# Patient Record
Sex: Female | Born: 1980 | Race: White | Hispanic: Yes | Marital: Single | Smoking: Former smoker
Health system: Southern US, Community
[De-identification: ages and names within clinical notes are randomized; demographics above are authoritative.]

## PROBLEM LIST (undated history)

## (undated) DIAGNOSIS — I10 Essential (primary) hypertension: Secondary | ICD-10-CM

## (undated) DIAGNOSIS — R7303 Prediabetes: Secondary | ICD-10-CM

## (undated) HISTORY — DX: Prediabetes: R73.03

## (undated) HISTORY — DX: Essential (primary) hypertension: I10

---

## 2016-06-24 ENCOUNTER — Encounter (INDEPENDENT_AMBULATORY_CARE_PROVIDER_SITE_OTHER): Payer: Self-pay | Admitting: Physician Assistant

## 2016-06-24 ENCOUNTER — Ambulatory Visit (INDEPENDENT_AMBULATORY_CARE_PROVIDER_SITE_OTHER): Payer: Self-pay | Admitting: Physician Assistant

## 2016-06-24 VITALS — BP 134/84 | HR 73 | Temp 97.9°F | Ht 65.5 in | Wt 270.4 lb

## 2016-06-24 DIAGNOSIS — I10 Essential (primary) hypertension: Secondary | ICD-10-CM

## 2016-06-24 DIAGNOSIS — G5601 Carpal tunnel syndrome, right upper limb: Secondary | ICD-10-CM

## 2016-06-24 DIAGNOSIS — R946 Abnormal results of thyroid function studies: Secondary | ICD-10-CM

## 2016-06-24 DIAGNOSIS — R7989 Other specified abnormal findings of blood chemistry: Secondary | ICD-10-CM

## 2016-06-24 DIAGNOSIS — Z79899 Other long term (current) drug therapy: Secondary | ICD-10-CM

## 2016-06-24 HISTORY — DX: Essential (primary) hypertension: I10

## 2016-06-24 LAB — POCT URINE PREGNANCY: Preg Test, Ur: NEGATIVE

## 2016-06-24 MED ORDER — LOSARTAN POTASSIUM-HCTZ 100-12.5 MG PO TABS: 1.0000 | ORAL_TABLET | Freq: Every day | ORAL | 1 refills | Status: DC

## 2016-06-24 MED ORDER — PREDNISONE 20 MG PO TABS: 20.0000 mg | ORAL_TABLET | Freq: Every day | ORAL | 0 refills | Status: AC

## 2016-06-24 MED FILL — LOSARTAN-HCTZ 100-12.5 MG T: 100-12.5 | 30 days supply | Qty: 30 | Fill #0

## 2016-06-24 MED FILL — predniSONE 20 MG TABS: 20 | 7 days supply | Qty: 7 | Fill #0
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
## 2016-06-24 NOTE — Patient Instructions (Signed)
Plan de alimentacin DASH (DASH Eating Plan) DASH es la sigla en ingls de "Enfoques Alimentarios para Detener la Hipertensin". El plan de alimentacin DASH ha demostrado bajar la presin arterial elevada (hipertensin). Los beneficios adicionales para la salud pueden incluir la disminucin del riesgo de diabetes mellitus tipo2, enfermedades cardacas e ictus. Este plan tambin puede ayudar a adelgazar. QU DEBO SABER ACERCA DEL PLAN DE ALIMENTACIN DASH? Para el plan de alimentacin DASH, seguir las siguientes pautas generales:  Elija los alimentos que contienen menos de 150 miligramos de sodio por porcin (segn se indica en la etiqueta de los alimentos).  Use hierbas o aderezos sin sal, en lugar de sal de mesa o sal marina.  Consulte al mdico o farmacutico antes de usar sustitutos de la sal.  Consuma los productos con menor contenido de sodio. Estos productos suelen estar etiquetados como "bajo en sodio" o "sin agregado de sal".  Coma alimentos frescos. No consuma una gran cantidad de alimentos enlatados.  Coma ms verduras, frutas y productos lcteos con bajo contenido de grasas.  Elija los cereales integrales. Busque la palabra "integral" en el primer lugar de la lista de ingredientes.  Elija el pescado y el pollo o el pavo sin piel ms a menudo que las carnes rojas. Limite el consumo de pescado, carne de ave y carne a 6onzas (170g) por da.  Limite el consumo de dulces, postres, azcares y bebidas azucaradas.  Elija las grasas saludables para el corazn.  Consuma ms comida casera y menos de restaurante, de buf y comida rpida.  Limite el consumo de alimentos fritos.  No fra los alimentos. A la hora de cocinarlos, opte por hornearlos, hervirlos, grillarlos y asarlos a la parrilla.  Cuando coma en un restaurante, pida que preparen su comida con menos sal o, en lo posible, sin nada de sal. QU ALIMENTOS PUEDO COMER? Pida ayuda a un nutricionista para conocer las  necesidades calricas individuales. Cereales Pan de salvado o integral. Arroz integral. Pastas de salvado o integrales. Quinua, trigo burgol y cereales integrales. Cereales con bajo contenido de sodio. Tortillas de harina de maz o de salvado. Pan de maz integral. Galletas saladas integrales. Galletas con bajo contenido de sodio. Vegetales Verduras frescas o congeladas (crudas, al vapor, asadas o grilladas). Jugos de tomate y verduras con contenido bajo o reducido de sodio. Pasta y salsa de tomate con contenido bajo o reducido de sodio. Verduras enlatadas con bajo contenido de sodio o reducido de sodio. Frutas Frutas frescas, en conserva (en su jugo natural) o frutas congeladas. Carnes y otros productos con protenas Carne de res molida (al 85% o ms magra), carne de res de animales alimentados con pastos o carne de res sin la grasa. Pollo o pavo sin piel. Carne de pollo o de pavo molida. Cerdo sin la grasa. Todos los pescados y frutos de mar. Huevos. Porotos, guisantes o lentejas secos. Frutos secos y semillas sin sal. Frijoles enlatados sin sal. Lcteos Productos lcteos con bajo contenido de grasas, como leche descremada o al 1%, quesos reducidos en grasas o al 2%, ricota con bajo contenido de grasas o queso cottage, o yogur natural con bajo contenido de grasas. Quesos con contenido bajo o reducido de sodio. Grasas y aceites Margarinas en barra que no contengan grasas trans. [place]onesa y alios para ensaladas livianos o reducidos en grasas (reducidos en sodio). [place]. Aceites de crtamo, oliva o canola. Mantequilla natural de man o almendra. Otros Palomitas de maz y pretzels sin sal. Los artculos mencionados arriba pueden no   ser [employer] de las bebidas o los alimentos recomendados. Comunquese con el nutricionista para conocer ms opciones. QU ALIMENTOS NO SE RECOMIENDAN? Cereales Pan blanco. Pastas blancas. Arroz blanco. Pan de maz refinado. Bagels y croissants. Galletas  saladas que contengan grasas trans. Vegetales Vegetales con crema o fritos. Verduras en salsa de [place]. Verduras enlatadas comunes. Pasta y salsa de tomate en lata comunes. Jugos comunes de tomate y de verduras. Nils Pyle Fruta enlatada en almbar liviano o espeso. Jugo de frutas. Carnes y otros productos con protenas Cortes de carne con Holiday representative. Costillas, alas de pollo, tocineta, salchicha, mortadela, salame, chinchulines, tocino, perros calientes, salchichas alemanas y embutidos envasados. Frutos secos y semillas con sal. Frijoles con sal en lata. Lcteos Leche entera o al 2%, crema, mezcla de [place] y crema, y queso crema. Yogur entero o endulzado. Quesos o queso azul con alto contenido de Neurosurgeon. Cremas no lcteas y coberturas batidas. Quesos procesados, quesos para untar o cuajadas. Condimentos Sal de cebolla y ajo, sal condimentada, sal de mesa y sal marina. Salsas en lata y envasadas. Salsa [place]. Salsa trtara. Salsa barbacoa. Salsa teriyaki. Salsa de soja, incluso la que tiene contenido reducido de [place]. Salsa de carne. Salsa de pescado. Salsa de [place]. Salsa rosada. Rbano picante. Ketchup y mostaza. Saborizantes y tiernizantes para carne. Caldo en cubitos. Salsa picante. Salsa tabasco. Adobos. Aderezos para tacos. Salsas. Grasas y [address], [place] en barra, [place] de [place], [place], [place] clarificada y Steffanie Rainwater de tocino. Aceites de coco, de palmiste o de palma. Aderezos comunes para ensalada. Otros Pickles y [place]. Palomitas de maz y pretzels con sal. Los artculos mencionados arriba pueden no ser [employer] de las bebidas y los alimentos que se Theatre stage manager. Comunquese con el nutricionista para obtener ms informacin. DNDE Raelyn Mora MS INFORMACIN? Instituto Nacional del [place], del Pulmn y de Risk manager (National Heart, Lung, and Blood Institute): [email or web address] Esta informacin no tiene Microbiologist el consejo del mdico. Asegrese de hacerle al mdico cualquier pregunta que tenga. Document Released: 03/27/2011 Document Revised: 07/30/2015 Document Reviewed: 02/09/2013 Elsevier Interactive Patient Education  2017 Elsevier Inc. Sndrome del tnel carpiano (Carpal Tunnel Syndrome) El sndrome del tnel carpiano es una afeccin que causa dolor en la mano y en el brazo. El tnel carpiano es un espacio estrecho ubicado en el lado palmar de la Ramsay. Los movimientos de la [place] o ciertas enfermedades pueden causar hinchazn del tnel. Esta hinchazn comprime el nervio principal de la mueca (nervio mediano). CAUSAS Esta afeccin puede ser causada por lo siguiente:  Movimientos repetidos de [employer].  Lesiones en la [place].  Artritis.  Un quiste o un tumor en el tnel carpiano.  Acumulacin de lquido Academic librarian. A veces, se desconoce la causa de esta afeccin. FACTORES DE RIESGO Es ms probable que esta afeccin se manifieste en:  Las personas que tienen trabajos en los que deben Humana Inc mismos movimientos repetidos de las [place], como los carniceros y los cajeros.  Las mujeres.  Las personas que tienen determinadas enfermedades, por ejemplo:  Diabetes.  Obesidad.  Tiroides hipoactiva (hipotiroidismo).  Insuficiencia renal. SNTOMAS Los sntomas de esta afeccin incluyen lo siguiente:  Sensacin de hormigueo en los dedos de la mano, Dentist, el ndice y el dedo [place].  Hormigueo o adormecimiento en la mano.  Sensacin de Engineer, mining en todo el brazo, especialmente cuando la [place] y el codo estn flexionados durante [place].  Dolor en la mueca que sube por el brazo  hasta el hombro.  Dolor que baja por la mano o los dedos.  Sensacin de [place] Oildebilidad en las manos. Tal vez tenga dificultad para tomar y Personal assistantsostener objetos. Los sntomas pueden empeorar durante la noche. DIAGNSTICO Esta afeccin se diagnostica mediante la historia  clnica y un examen fsico. Tambin pueden hacerle exmenes, que incluyen los siguientes:  Electromiografa (EMG). Esta prueba mide las seales elctricas que los nervios les envan a los msculos.  Radiografas. TRATAMIENTO El tratamiento de esta afeccin incluye lo siguiente:  Cambios en el estilo de vida. Es importante dejar de Radio producerhacer o modificar la actividad que caus la afeccin.  Fisioterapia o terapia ocupacional.  Analgsicos y antiinflamatorios. Esto puede incluir medicamentos que se inyectan en la [place].  Una frula para la [place].  [place]uga. INSTRUCCIONES PARA EL CUIDADO EN EL HOGAR Si tiene una frula:  sela como se lo haya indicado el mdico. Qutesela solamente como se lo haya indicado el mdico.  Afloje la frula si los dedos se le entumecen, siente hormigueos o se le enfran y se tornan de Research officer, trade unioncolor azul.  Mantenga la frula limpia y seca. Instrucciones generales  Baxter Internationalome los medicamentos de venta libre y los recetados solamente como se lo haya indicado el mdico.  Haga reposar la Chemultmueca de toda actividad que le cause dolor. Si la afeccin tiene relacin con Kathie Dikeel trabajo, hable con su empleador Tribune Companysobre los cambios que pueden Welshhacerse, por Centerviewejemplo, usar una almohadilla para apoyar la mueca mientras tipea.  Si se lo indican, aplique hielo sobre la zona dolorida:  Ponga el hielo en una bolsa plstica.  Coloque una toalla entre la piel y la bolsa de hielo.  Coloque el hielo durante 20minutos, 2 a 3veces por Futures traderda.  Concurra a todas las visitas de control como se lo haya indicado el mdico. Esto es importante.  Haga los ejercicios como se lo hayan indicado el mdico, el fisioterapeuta o el terapeuta ocupacional. SOLICITE ATENCIN MDICA SI:  Aparecen nuevos sntomas.  El dolor no se alivia con los United Parcelmedicamentos.  Los sntomas empeoran. Esta informacin no tiene Theme park managercomo fin reemplazar el consejo del mdico. Asegrese de hacerle al mdico cualquier pregunta que  tenga. Document Released: 04/07/2005 Document Revised: 07/30/2015 Document Reviewed: 08/23/2014 Elsevier Interactive Patient Education  2017 ArvinMeritorElsevier Inc.

## 2016-06-25 LAB — CBC WITH DIFFERENTIAL/PLATELET
BASOS ABS: 0.1 10*3/uL (ref 0.0–0.2)
Basos: 1 %
EOS (ABSOLUTE): 0.2 10*3/uL (ref 0.0–0.4)
Eos: 2 %
Hematocrit: 41.8 % (ref 34.0–46.6)
Hemoglobin: 14 g/dL (ref 11.1–15.9)
IMMATURE GRANULOCYTES: 0 %
Immature Grans (Abs): 0 10*3/uL (ref 0.0–0.1)
Lymphocytes Absolute: 3.5 10*3/uL — ABNORMAL HIGH (ref 0.7–3.1)
Lymphs: 34 %
MCH: 30 pg (ref 26.6–33.0)
MCHC: 33.5 g/dL (ref 31.5–35.7)
MCV: 90 fL (ref 79–97)
MONOS ABS: 0.6 10*3/uL (ref 0.1–0.9)
Monocytes: 6 %
NEUTROS PCT: 57 %
Neutrophils Absolute: 5.8 10*3/uL (ref 1.4–7.0)
PLATELETS: 420 10*3/uL — AB (ref 150–379)
RBC: 4.67 x10E6/uL (ref 3.77–5.28)
RDW: 13.6 % (ref 12.3–15.4)
WBC: 10.1 10*3/uL (ref 3.4–10.8)

## 2016-06-25 LAB — COMPREHENSIVE METABOLIC PANEL
A/G RATIO: 1.5 (ref 1.2–2.2)
ALT: 42 IU/L — AB (ref 0–32)
AST: 39 IU/L (ref 0–40)
Albumin: 4.3 g/dL (ref 3.5–5.5)
Alkaline Phosphatase: 86 IU/L (ref 39–117)
BUN/Creatinine Ratio: 16 (ref 9–23)
BUN: 14 mg/dL (ref 6–20)
Bilirubin Total: 0.4 mg/dL (ref 0.0–1.2)
CALCIUM: 9.3 mg/dL (ref 8.7–10.2)
CO2: 26 mmol/L (ref 18–29)
CREATININE: 0.86 mg/dL (ref 0.57–1.00)
Chloride: 99 mmol/L (ref 96–106)
GFR calc Af Amer: 101 mL/min/{1.73_m2} (ref >59)
GFR, EST NON AFRICAN AMERICAN: 88 mL/min/{1.73_m2} (ref >59)
GLUCOSE: 95 mg/dL (ref 65–99)
Globulin, Total: 2.9 g/dL (ref 1.5–4.5)
POTASSIUM: 3.9 mmol/L (ref 3.5–5.2)
Sodium: 140 mmol/L (ref 134–144)
Total Protein: 7.2 g/dL (ref 6.0–8.5)

## 2016-06-25 LAB — LIPID PANEL
CHOL/HDL RATIO: 3.8 ratio (ref 0.0–4.4)
Cholesterol, Total: 148 mg/dL (ref 100–199)
HDL: 39 mg/dL — AB (ref >39)
LDL Calculated: 86 mg/dL (ref 0–99)
TRIGLYCERIDES: 114 mg/dL (ref 0–149)
VLDL Cholesterol Cal: 23 mg/dL (ref 5–40)

## 2016-06-25 LAB — THYROID PANEL WITH TSH
Free Thyroxine Index: 1.7 (ref 1.2–4.9)
T3 Uptake Ratio: 21 % — ABNORMAL LOW (ref 24–39)
T4, Total: 8.1 ug/dL (ref 4.5–12.0)
TSH: 3.92 u[IU]/mL (ref 0.450–4.500)

## 2016-07-04 ENCOUNTER — Ambulatory Visit (INDEPENDENT_AMBULATORY_CARE_PROVIDER_SITE_OTHER): Payer: Self-pay

## 2016-07-23 MED FILL — LOSARTAN-HCTZ 100-12.5 MG T: 100-12.5 | 30 days supply | Qty: 30 | Fill #1

## 2016-08-21 MED FILL — LOSARTAN-HCTZ 100-12.5 MG T: 100-12.5 | 30 days supply | Qty: 30 | Fill #2

## 2016-09-19 MED FILL — LOSARTAN-HCTZ 100-12.5 MG T: 100-12.5 | 30 days supply | Qty: 30 | Fill #3

## 2016-10-07 ENCOUNTER — Encounter: Payer: Self-pay | Admitting: Internal Medicine

## 2016-10-07 ENCOUNTER — Other Ambulatory Visit (HOSPITAL_COMMUNITY)
Admission: RE | Admit: 2016-10-07 | Discharge: 2016-10-07 | Disposition: A | Discharge status: Home / Self Care | Payer: BLUE CROSS/BLUE SHIELD | Source: Ambulatory Visit | Attending: Internal Medicine | Admitting: Internal Medicine

## 2016-10-07 ENCOUNTER — Ambulatory Visit: Payer: BLUE CROSS/BLUE SHIELD | Attending: Internal Medicine | Admitting: Internal Medicine

## 2016-10-07 VITALS — BP 124/86 | HR 83 | Temp 98.1°F | Resp 16 | Wt 273.0 lb

## 2016-10-07 DIAGNOSIS — Z23 Encounter for immunization: Secondary | ICD-10-CM | POA: Diagnosis not present

## 2016-10-07 DIAGNOSIS — Z808 Family history of malignant neoplasm of other organs or systems: Secondary | ICD-10-CM | POA: Insufficient documentation

## 2016-10-07 DIAGNOSIS — J301 Allergic rhinitis due to pollen: Secondary | ICD-10-CM | POA: Diagnosis not present

## 2016-10-07 DIAGNOSIS — Z87891 Personal history of nicotine dependence: Secondary | ICD-10-CM | POA: Insufficient documentation

## 2016-10-07 DIAGNOSIS — Z131 Encounter for screening for diabetes mellitus: Secondary | ICD-10-CM | POA: Diagnosis not present

## 2016-10-07 DIAGNOSIS — Z833 Family history of diabetes mellitus: Secondary | ICD-10-CM | POA: Insufficient documentation

## 2016-10-07 DIAGNOSIS — M7712 Lateral epicondylitis, left elbow: Secondary | ICD-10-CM | POA: Diagnosis not present

## 2016-10-07 DIAGNOSIS — I10 Essential (primary) hypertension: Secondary | ICD-10-CM | POA: Insufficient documentation

## 2016-10-07 DIAGNOSIS — R7303 Prediabetes: Secondary | ICD-10-CM | POA: Diagnosis not present

## 2016-10-07 DIAGNOSIS — J302 Other seasonal allergic rhinitis: Secondary | ICD-10-CM | POA: Insufficient documentation

## 2016-10-07 DIAGNOSIS — N911 Secondary amenorrhea: Secondary | ICD-10-CM | POA: Insufficient documentation

## 2016-10-07 DIAGNOSIS — N6320 Unspecified lump in the left breast, unspecified quadrant: Secondary | ICD-10-CM | POA: Diagnosis not present

## 2016-10-07 DIAGNOSIS — Z8249 Family history of ischemic heart disease and other diseases of the circulatory system: Secondary | ICD-10-CM | POA: Insufficient documentation

## 2016-10-07 DIAGNOSIS — Z124 Encounter for screening for malignant neoplasm of cervix: Secondary | ICD-10-CM | POA: Insufficient documentation

## 2016-10-07 DIAGNOSIS — G5603 Carpal tunnel syndrome, bilateral upper limbs: Secondary | ICD-10-CM | POA: Insufficient documentation

## 2016-10-07 DIAGNOSIS — M7711 Lateral epicondylitis, right elbow: Secondary | ICD-10-CM | POA: Insufficient documentation

## 2016-10-07 LAB — POCT GLYCOSYLATED HEMOGLOBIN (HGB A1C): Hemoglobin A1C: 6.1

## 2016-10-07 LAB — POCT URINE PREGNANCY: Preg Test, Ur: NEGATIVE

## 2016-10-07 MED ORDER — FLUTICASONE PROPIONATE 50 MCG/ACT NA SUSP: 2.0000 | Freq: Every day | NASAL | 6 refills | Status: DC

## 2016-10-07 MED ORDER — CETIRIZINE HCL 10 MG PO TABS: 10.0000 mg | ORAL_TABLET | Freq: Every day | ORAL | 6 refills | Status: AC

## 2016-10-07 MED ORDER — MEDROXYPROGESTERONE ACETATE 10 MG PO TABS: ORAL_TABLET | ORAL | 6 refills | Status: AC

## 2016-10-07 MED ORDER — DICLOFENAC SODIUM 1 % TD GEL: 2.0000 g | Freq: Four times a day (QID) | TRANSDERMAL | 2 refills | Status: DC

## 2016-10-07 MED ORDER — METFORMIN HCL 500 MG PO TABS: 500.0000 mg | ORAL_TABLET | Freq: Every day | ORAL | 3 refills | Status: DC

## 2016-10-07 MED ORDER — PSEUDOEPHEDRINE HCL 30 MG PO TABS: 30.0000 mg | ORAL_TABLET | Freq: Three times a day (TID) | ORAL | 0 refills | Status: DC | PRN

## 2016-10-07 MED FILL — VOLTAREN 1% GEL: 1 | 12 days supply | Qty: 100 | Fill #0

## 2016-10-07 MED FILL — ?CETIRIZINE HCL 10 MG TABLE: 10 | 30 days supply | Qty: 30 | Fill #0

## 2016-10-07 MED FILL — ?METFORMIN HCL 500MG TABLET: 500 | 30 days supply | Qty: 30 | Fill #0

## 2016-10-07 MED FILL — FLUTICASONE PROP 50 MCG SPR: 50 | 30 days supply | Qty: 16 | Fill #0

## 2016-10-07 MED FILL — MEDROXYPROGESTERONE 10 MG T: 10 | 7 days supply | Qty: 7 | Fill #0
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
## 2016-10-07 NOTE — Progress Notes (Signed)
Pt states [place] has not had a period in 4 months

## 2016-10-07 NOTE — Patient Instructions (Addendum)
Prediabetes (Prediabetes) QU ES LA PREDIABETES? La prediabetes es la enfermedad que presenta un nivel de azcar en la sangre (glucemia) ms alto de lo normal, pero no lo suficientemente alto como para que le diagnostiquen diabetes tipo2. El hecho de ser prediabtico lo pone en riesgo de desarrollar diabetes tipo2 (diabetes mellitus tipo2). La prediabetes tambin se puede llamar intolerancia a la glucosa o glucosa alterada en ayunas. Generalmente, la prediabetes no causa sntomas. El mdico puede diagnosticar esta enfermedad por los anlisis de sangre. Los anlisis para detectar la prediabetes se pueden realizar si usted tiene sobrepeso y si presenta al menos un factor de riesgo ms de prediabetes. Entre los factores de riesgo de prediabetes, se incluyen los siguientes:  Tener un familiar con diabetes tipo2.  Sobrepeso u obesidad.  Tener ms de 45 aos.  Ser descendiente de indgenas norteamericanos, afroamericanos, hispanos o latinos, o asiticos o isleos del Pacfico.  Tener un estilo de vida inactivo (sedentario).  Tener antecedentes de diabetes gestacional o sndrome de ovario poliqustico (SOP).  Tener niveles bajos del colesterol bueno (HDL-C) o niveles altos de grasas en la sangre (triglicridos).  Tener hipertensin arterial. QU ES LA GLUCEMIA Y CMO SE MIDE? La glucemia hace referencia a la cantidad de glucosa que tiene en el torrente sanguneo. La glucosa proviene de los alimentos que contienen azcar y almidn (carbohidratos) que el organismo descompone para formar glucosa. El nivel de glucemia se puede medir en mg/dl (miligramos por decilitro) o mmol/l (milimoles por litro).La glucemia puede controlarse con uno o ms de los siguientes anlisis de sangre:  Medicin de la glucemia en ayunas. No se le permitir comer (tendr que hacer ayuno) durante al menos 8horas antes de que se tome una muestra de sangre. ? Un rango normal de glucemia en ayunas es de 70 a 100mg/dl (de  3,9 a 5,6mmol/l).  Un anlisis de sangre de A1c (hemoglobina A1c). Este anlisis proporciona informacin sobre el control de la glucemia durante los ltimos 2 o 3meses.  Prueba de tolerancia a la glucosa oral (PTGO). Esta prueba mide la glucemia dos veces: ? Despus del ayuno. Este es el valor inicial. ? Dos horas despus de ingerir una bebida que contiene glucosa. Pueden diagnosticarle prediabetes en los siguientes casos:  Si la glucemia en ayunas es de 100 a 125mg/dl (de 5,6 a 6,9mmol/l).  Si el nivel de A1c es del 5,7% al 6,4%.  Si el resultado de la PTGO es de 140 a 199mg/dl (de 7,8 a 11mmol/l). Estos anlisis de sangre se pueden repetir para confirmar el diagnstico. QU SUCEDE SI LA GLUCEMIA ES DEMASIADO ALTA? El pncreas produce una hormona (insulina) que ayuda a mover la glucosa desde el torrente sanguneo hacia las clulas. Cuando las clulas no responden de forma adecuada a la insulina que el organismo produce (resistencia a la insulina), el exceso de glucosa se acumula en la sangre en vez de dirigirse hacia las clulas. Como consecuencia, se puede desarrollar glucemia alta (hiperglucemia), que puede causar muchas complicaciones. Este es uno de los sntomas de la prediabetes. QU PUEDE SUCEDER SI LA GLUCEMIA PERMANECE MS ALTA DE LO NORMAL DURANTE MUCHO TIEMPO? Es peligroso tener la glucemia alta durante mucho tiempo. Demasiada glucosa en la sangre puede daar los nervios y los vasos sanguneos. El dao a largo plazo puede provocar complicaciones de la diabetes, por ejemplo:  Cardiopata.  Ictus.  Ceguera.  Enfermedad renal.  Depresin.  Mala circulacin en los pies y en las piernas, que podra llevar a la extraccin quirrgica (amputacin) en   casos graves. CMO SE PUEDE EVITAR QUE LA PREDIABETES SE CONVIERTA EN DIABETES TIPO2? Para prevenir la diabetes tipo2, tome las siguientes medidas:  Haga actividad fsica. ? Haga actividad fsica de intensidad moderada  durante al menos 30minutos como mnimo 5das por Wells Fargosemana, o tanto como le haya indicado el mdico. Podra hacer caminatas dinmicas, ciclismo o Moroccogimnasia acutica. ? Pregntele al mdico qu actividades son seguras para usted. Una combinacin de actividades puede ser la mejor opcin, por ejemplo, caminar, practicar natacin, andar en bicicleta y hacer entrenamiento de fuerza.  Baje de General Electricpeso como se lo haya indicado el mdico. ? Bajar entre el 5% y el 7% del peso corporal puede revertir la resistencia a la insulina. ? El mdico puede determinar cuntos kilos tiene que bajar y Adamsayudarlo a que adelgace de Wellsite geologistmanera segura.  Siga un plan de alimentacin saludable. Este incluye consumir protenas magras, hidratos de carbono complejos, frutas y verduras frescas, productos lcteos con bajo contenido de grasa y grasas saludables. ? Siga las indicaciones del mdico respecto de las restricciones para las comidas o las bebidas. ? Programe una cita con un especialista en alimentacin y nutricin (nutricionista certificado) para que lo ayude a Quarry managerarmar un plan de alimentacin saludable adecuado para usted.  No fume ni consuma ningn producto que contenga tabaco, lo que incluye cigarrillos, tabaco de Theatre managermascar y Administrator, Civil Servicecigarrillos electrnicos. Si necesita ayuda para dejar de fumar, consulte al American Expressmdico.  Baxter Internationalome los medicamentos de venta libre y los recetados como se lo haya indicado el mdico. Es posible que le receten medicamentos que ayuden a disminuir el riesgo de tener diabetes tipo2. Esta informacin no tiene Theme park managercomo fin reemplazar el consejo del mdico. Asegrese de hacerle al mdico cualquier pregunta que tenga. Document Released: 05/29/2015 Document Revised: 05/29/2015 Document Reviewed: 05/29/2015 Elsevier Interactive Patient Education  2018 [employer] provider. Make sure you discuss any questions you have with your health care provider. Document Released: 02/02/2006 Document Revised: 12/27/2015 Document Reviewed:  12/27/2015 Elsevier Interactive Patient Education  2017 Elsevier Inc.  Madilyn FiremanVacuna Td (contra la difteria y el ttanos): Lo que debe saber (Td Vaccine [place][Tetanus and Diphtheria]: What You Need to Know) 1. Por qu vacunarse? El ttanos y la difteria son enfermedades muy graves. Son Academic librarianpoco frecuentes en los [place] actualmente, pero las personas que se infectan suelen tener complicaciones graves. La vacuna Td se [place]a para proteger a los adolescentes y a los adultos de ambas enfermedades. Tanto el ttanos como la difteria son infecciones causadas por bacterias. La difteria se transmite de persona a persona a travs de la tos o el estornudo. La bacteria que causa el ttanos entra al cuerpo a travs de cortes, raspones o heridas. El TTANOS (trismo) provoca entumecimiento y Engineer, materialscontraccin dolorosa de los msculos, por lo general, en todo el cuerpo.  Puede causar el endurecimiento de los msculos de la cabeza y el cuello, de modo que impide abrir la boca, tragar y en algunos casos, Industrial/product designerrespirar. El ttanos es causa de muerte en aproximadamente 1de cada 10personas que contraen la infeccin, incluso despus de que reciben la mejor atencin mdica. La DIFTERIA puede hacer que se forme una membrana gruesa en la parte posterior de la garganta.  Puede causar problemas respiratorios, parlisis, insuficiencia cardaca e incluso la muerte. Antes de las vacunas, en los [place] se informaban 200000 casos de difteria y cientos de casos de ttanos cada ao. Desde que comenz la vacunacin, los informes de casos de ambas enfermedades se han reducido en un 99%. 2. Vacuna Td La  vacuna Td protege a adolescentes y adultos contra el ttanos y la difteria. La vacuna Td habitualmente se aplica como dosis de refuerzo cada 10aos, pero tambin puede administrarse antes si la persona sufre una [place] o herida sucia y grave. A veces, en lugar de la vacuna Td, se recomienda una vacuna llamada Tdap, que protege contra la  tosferina, adems de proteger contra el ttanos y la difteria. El mdico o la persona que le aplique la vacuna puede darle ms informacin al [employer]. La Td puede administrarse de manera segura simultneamente con otras vacunas. 3. Algunas personas no deben recibir la vacuna  Una persona que alguna vez ha tenido una reaccin alrgica potencialmente mortal a una dosis anterior de cualquier vacuna contra el ttanos o la difteria, O que tenga una alergia grave a cualquier parte de esta vacuna, no debe recibir la vacuna Td. Informe a la persona que le aplica la vacuna si usted tiene cualquier alergia grave.  Consulte con su mdico si: ? tuvo hinchazn o dolor intenso despus de recibir cualquier vacuna contra la difteria o el ttanos, ? alguna vez ha sufrido el sndrome de Pension scheme manager, ? no se siente Research scientist (life sciences) en que se ha programado la vacuna.  4. Riesgos de [place] reaccin a la vacuna Con cualquier medicamento, incluyendo las vacunas, existe la posibilidad de que aparezcan efectos secundarios. Suelen ser leves y desaparecen por s solos. Tambin son posibles las reacciones graves, pero en raras ocasiones. La [employer] de las personas a las que se les aplica la vacuna Td no tienen ningn problema. Problemas leves despus de la vacuna Td: (No interfirieron en otras actividades)  Dolor en el lugar donde se aplic la vacuna (alrededor de 8de cada 10personas)  Enrojecimiento o hinchazn en el lugar donde se aplic la vacuna (alrededor de 1de cada 4personas)  Fiebre leve (poco frecuente)  Dolor de Training and development officer (alrededor de 1de cada 4personas)  Cansancio (alrededor de 1de cada 4personas) Problemas moderados despus de la vacuna Td: (Interfirieron en otras actividades, pero no requirieron atencin mdica)  Fiebre superior a 102F (38,8C) (poco frecuente) Problemas graves despus de la vacuna Td: (Impidieron Education officer, environmental las actividades habituales; requirieron atencin mdica)  Buyer, retail,  dolor intenso, sangrado o enrojecimiento en el brazo en que se aplic la vacuna (poco frecuente). Problemas que podran ocurrir despus de cualquier vacuna:  Las personas a veces se desmayan despus de un procedimiento mdico, incluida la vacunacin. Si permanece sentado o recostado durante 15 minutos puede ayudar a [employer] y las lesiones causadas por las cadas. Informe al mdico si se siente mareado, tiene cambios en la visin o zumbidos en los odos.  Algunas personas sienten un dolor intenso en el hombro y tienen dificultad para mover el brazo donde se coloc la vacuna. Esto sucede con muy poca frecuencia.  Cualquier medicamento puede causar una reaccin alrgica grave. Dichas reacciones son Lynnae Sandhoff poco frecuentes con una vacuna (se calcula que menos de 1en un milln de dosis) y se producen de unos minutos a unas horas despus de Arts development officer. Al igual que con cualquier [employer] Data, existe una probabilidad muy remota de que una vacuna cause una lesin grave o la [place]. Se controla permanentemente la seguridad de las vacunas. Para obtener ms informacin, visite: http://floyd.org/. 5. Qu pasa si hay una reaccin grave? A qu signos debo estar atento?  Observe todo lo que le preocupe, como signos de una reaccin alrgica grave, fiebre muy alta o comportamiento fuera de lo normal. Los signos de [place] reaccin  alrgica grave pueden incluir ronchas, hinchazn de la cara y la garganta, dificultad para respirar, latidos cardacos acelerados, mareos y debilidad. Generalmente, estos comenzaran entre unos pocos minutos y algunas horas despus de la vacunacin. Qu debo hacer?  Si usted piensa que se trata de una reaccin alrgica grave o de otra emergencia que no puede esperar, llame al 911 o dirjase al hospital ms cercano. Sino, llame a su mdico.  Despus, la reaccin debe informarse al [id number] de Informacin sobre Efectos Adversos de las [place] (Vaccine Adverse Event  Reporting System, VAERS). Su mdico puede presentar este informe, o puede hacerlo usted mismo a travs del sitio web de VAERS, en www.vaers.LAgents.no, o llamando al [phone]. VAERS no brinda recomendaciones mdicas. 6. [employer] de Compensacin de Daos por [employer] Power [place] de Compensacin de Daos por Administrator, arts (National Vaccine Injury Compensation Program, VICP) es un programa federal que fue creado para Patent examiner a las personas que puedan haber sufrido daos al recibir ciertas vacunas. Aquellas personas que consideren que han sufrido un dao como consecuencia de una vacuna y [place] saber ms acerca del programa y de cmo presentar Roslynn Amble, pueden llamar al [phone] o visitar su sitio web en [email or web address]. Hay un lmite de tiempo para presentar un reclamo de compensacin. 7. Cmo puedo obtener ms informacin?  Consulte a su mdico. Este puede darle el prospecto de la vacuna o recomendarle otras fuentes de informacin.  Comunquese con el servicio de salud de su localidad o [address].  Comunquese con los Centros para Air traffic controller y la Prevencin de Child psychotherapist for Disease Control and Prevention , CDC). ? Llame al 7650906677 (1-800-CDC-INFO). ? Visite el sitio Environmental manager en [email or web address]. Declaracin de informacin sobre la vacuna contra la difteria y el ttanos (Td) de los CDC (07/31/15) Esta informacin no tiene Theme park manager el consejo del mdico. Asegrese de hacerle al mdico cualquier pregunta que tenga. Document Released: 07/24/2008 Document Revised: 04/28/2014 Document Reviewed: 07/31/2015 Elsevier Interactive Patient Education  2017 [employer].

## 2016-10-08 ENCOUNTER — Telehealth: Payer: Self-pay

## 2016-10-08 LAB — CERVICOVAGINAL ANCILLARY ONLY
Bacterial vaginitis: POSITIVE — AB
CHLAMYDIA, DNA PROBE: NEGATIVE
Candida vaginitis: NEGATIVE
NEISSERIA GONORRHEA: NEGATIVE
TRICH (WINDOWPATH): NEGATIVE

## 2016-10-08 LAB — HIV ANTIBODY (ROUTINE TESTING W REFLEX): HIV SCREEN 4TH GENERATION: NONREACTIVE

## 2016-10-08 NOTE — Telephone Encounter (Signed)
[employer] Francesco RunnerSander Luz Id# [id number] contacted pt to go over lab results pt didn't answer lvm asking pt to give me a call at her earliest convenience   If pt calls back please give results: HIV test negative. Await PAP results.

## 2016-10-09 ENCOUNTER — Other Ambulatory Visit: Payer: Self-pay | Admitting: Internal Medicine

## 2016-10-09 DIAGNOSIS — N760 Acute vaginitis: Secondary | ICD-10-CM

## 2016-10-09 DIAGNOSIS — B9689 Other specified bacterial agents as the cause of diseases classified elsewhere: Secondary | ICD-10-CM

## 2016-10-09 DIAGNOSIS — R87610 Atypical squamous cells of undetermined significance on cytologic smear of cervix (ASC-US): Secondary | ICD-10-CM

## 2016-10-09 LAB — CYTOLOGY - PAP
DIAGNOSIS: UNDETERMINED — AB
HPV (WINDOPATH): DETECTED — AB

## 2016-10-09 MED ORDER — METRONIDAZOLE 500 MG PO TABS: 500.0000 mg | ORAL_TABLET | Freq: Three times a day (TID) | ORAL | 0 refills | Status: DC

## 2016-10-10 MED FILL — ?METRONIDAZOLE 500 MG TABLE: 500 | 7 days supply | Qty: 21 | Fill #0

## 2016-10-13 ENCOUNTER — Other Ambulatory Visit: Payer: Self-pay | Admitting: Internal Medicine

## 2016-10-13 DIAGNOSIS — N6320 Unspecified lump in the left breast, unspecified quadrant: Secondary | ICD-10-CM

## 2016-10-14 ENCOUNTER — Ambulatory Visit (INDEPENDENT_AMBULATORY_CARE_PROVIDER_SITE_OTHER): Payer: BLUE CROSS/BLUE SHIELD | Admitting: Orthopaedic Surgery

## 2016-10-14 DIAGNOSIS — G5601 Carpal tunnel syndrome, right upper limb: Secondary | ICD-10-CM | POA: Diagnosis not present

## 2016-10-14 DIAGNOSIS — M7712 Lateral epicondylitis, left elbow: Secondary | ICD-10-CM

## 2016-10-14 DIAGNOSIS — M7711 Lateral epicondylitis, right elbow: Secondary | ICD-10-CM | POA: Diagnosis not present

## 2016-10-14 DIAGNOSIS — G5602 Carpal tunnel syndrome, left upper limb: Secondary | ICD-10-CM

## 2016-10-14 MED ORDER — LIDOCAINE HCL 1 % IJ SOLN
1.0000 mL | INTRAMUSCULAR | Status: AC | PRN
  Administered 2016-10-14: 1 mL

## 2016-10-14 MED ORDER — BUPIVACAINE HCL 0.5 % IJ SOLN
1.0000 mL | INTRAMUSCULAR | Status: AC | PRN
  Administered 2016-10-14: 1 mL

## 2016-10-14 MED ORDER — METHYLPREDNISOLONE ACETATE 40 MG/ML IJ SUSP
40.0000 mg | INTRAMUSCULAR | Status: AC | PRN
  Administered 2016-10-14: 40 mg

## 2016-10-14 NOTE — Progress Notes (Signed)
Office Visit Note   Patient: Cheryl Manning           Date of Birth: November 03, 1980           MRN: [id number] Visit Date: 10/14/2016              Requested by: Marcine Matar, MD [address] [place], [place] [address] PCP: Marcine Matar, MD   Assessment & Plan: Visit Diagnoses:  1. Right carpal tunnel syndrome   2. Left carpal tunnel syndrome   3. Lateral epicondylitis of both elbows     Plan: Overall impression is bilateral tennis elbow and bilateral carpal tunnel syndrome. Elbows were injected today under sterile conditions. Nerve conduction studies ordered. Follow-up after the nerve conduction studies. Physical therapy was ordered for her tennis elbow. Today's encounter was performed through an interpreter which did increase the complexity of the visit.  Follow-Up Instructions: Return if symptoms worsen or fail to improve.   Orders:  No orders of the defined types were placed in this encounter.  No orders of the defined types were placed in this encounter.     Procedures: Hand/UE Inj Date/Time: 10/14/2016 11:27 AM Performed by: Tarry Kos Authorized by: Tarry Kos   Consent Given by:  Patient Timeout: prior to procedure the correct patient, procedure, and site was verified   Indications:  Pain Condition: lateral epicondylitis   Site:  R elbow Prep: patient was prepped and draped in usual sterile fashion   Needle Size:  25 G Medications:  1 mL bupivacaine 0.5 %; 1 mL lidocaine 1 %; 40 mg methylPREDNISolone acetate 40 MG/ML Hand/UE Inj Date/Time: 10/14/2016 11:27 AM Performed by: Tarry Kos Authorized by: Tarry Kos   Consent Given by:  Patient Timeout: prior to procedure the correct patient, procedure, and site was verified   Indications:  Pain Condition: lateral epicondylitis   Site:  L elbow Prep: patient was prepped and draped in usual sterile fashion   Needle Size:  25 G Medications:  1 mL bupivacaine 0.5 %; 1 mL lidocaine 1 %;  40 mg methylPREDNISolone acetate 40 MG/ML     Clinical Data: No additional findings.   Subjective: Chief Complaint  Patient presents with  . Right Wrist - Pain  . Left Wrist - Pain    Patient is a 36 year old female comes in with her lower elbow and bilateral hand pain. She was told that she had carpal tunnel syndrome but has not had nerve conduction studies. She has trouble sleeping. She is worn nighttime braces with no relief. She complains of constant pain and burning in both of her hands. She doesn't really have bilateral elbow pain is worse with activity. Pain does not radiate. Denies any numbness or tingling.    Review of Systems  Constitutional: Negative.   HENT: Negative.   Eyes: Negative.   Respiratory: Negative.   Cardiovascular: Negative.   Endocrine: Negative.   Musculoskeletal: Negative.   Neurological: Negative.   Hematological: Negative.   Psychiatric/Behavioral: Negative.   All other systems reviewed and are negative.    Objective: Vital Signs: There were no vitals taken for this visit.  Physical Exam  Constitutional: She is oriented to person, place, and time. She appears well-developed and well-nourished.  HENT:  Head: Normocephalic and atraumatic.  Eyes: EOM are normal.  Neck: Neck supple.  Pulmonary/Chest: Effort normal.  Abdominal: Soft.  Neurological: She is alert and oriented to person, place, and time.  Skin: Skin is warm. Capillary  refill takes less than 2 seconds.  Psychiatric: She has a normal mood and affect. Her behavior is normal. Judgment and thought content normal.  Nursing note and vitals reviewed.   Ortho Exam Bilateral elbow exam shows tenderness to palpation over the lateral epicondyle. Radial tunnel is nontender. Negative Tinel at radial tunnel. She has good range of motion of the elbow.  Bilateral hand exam shows negative carpal tunnel compression signs. No muscular atrophy. Specialty Comments:  No specialty comments  available.  Imaging: No results found.   PMFS History: Patient Active Problem List   Diagnosis Date Noted  . Diabetes mellitus screening 10/07/2016  . Seasonal allergic rhinitis due to pollen 10/07/2016  . Lateral epicondylitis of both elbows 10/07/2016  . Bilateral carpal tunnel syndrome 10/07/2016  . Left breast lump 10/07/2016  . Secondary amenorrhea 10/07/2016  . HTN (hypertension) 06/24/2016   No past medical history on file.  Family History  Problem Relation Age of Onset  . Diabetes Father   . Hypertension Father   . Uterine cancer Sister   . Hypertension Brother   . Drug abuse Paternal Uncle   . Uterine cancer Maternal Grandmother   . Uterine cancer Paternal Grandmother     No past surgical history on file. Social History   Occupational History  . Not on file.   Social History Main Topics  . Smoking status: Former Games developermoker  . Smokeless tobacco: Never Used  . Alcohol use Not on file  . Drug use: Unknown  . Sexual activity: Not on file

## 2016-10-17 MED FILL — MEDROXYPROGESTERONE 10 MG T: 10 | 30 days supply | Qty: 7 | Fill #1

## 2016-10-17 MED FILL — LOSARTAN-HCTZ 100-12.5 MG T: 100-12.5 | 30 days supply | Qty: 30 | Fill #4

## 2016-10-21 ENCOUNTER — Encounter: Payer: Self-pay | Admitting: Physical Therapy

## 2016-10-21 ENCOUNTER — Ambulatory Visit: Payer: BLUE CROSS/BLUE SHIELD | Attending: Orthopaedic Surgery | Admitting: Physical Therapy

## 2016-10-21 DIAGNOSIS — M25631 Stiffness of right wrist, not elsewhere classified: Secondary | ICD-10-CM | POA: Diagnosis present

## 2016-10-21 DIAGNOSIS — M79601 Pain in right arm: Secondary | ICD-10-CM | POA: Diagnosis present

## 2016-10-21 DIAGNOSIS — R209 Unspecified disturbances of skin sensation: Secondary | ICD-10-CM

## 2016-10-21 DIAGNOSIS — M25632 Stiffness of left wrist, not elsewhere classified: Secondary | ICD-10-CM | POA: Insufficient documentation

## 2016-10-21 DIAGNOSIS — M79602 Pain in left arm: Secondary | ICD-10-CM | POA: Diagnosis not present

## 2016-10-21 NOTE — Therapy (Signed)
[facility] [address] [place], [place], [address] Phone: [phone]   Fax:  [phone]  Physical Therapy Evaluation  Patient Details  Name: Cheryl Manning MRN: [id number] Date of Birth: [birth year] Referring Provider: Dr. Roda Shutters   Encounter Date: 10/21/2016      PT End of Session - 10/21/16 0857    Visit Number 1   Number of Visits 12   Date for PT Re-Evaluation 12/02/16   PT Start Time 0755   PT Stop Time 0842   PT Time Calculation (min) 47 min   Activity Tolerance Patient tolerated treatment well   Behavior During Therapy [facility] for tasks assessed/performed      History reviewed. No pertinent past medical history.  History reviewed. No pertinent surgical history.  There were no vitals filed for this visit.       Subjective Assessment - 10/21/16 0802    Subjective Pt reports onset of pain about 4 mos ago.  The pain began gradually, is intermittent during the day, she feels most of her pain at night.  She has numbness in her digits 1-3 and get warm and purple only while she sleeps.  She reports swelling in elbows.  Denies neck pain.    Pertinent History CTS, diabetes and HTN   Limitations Lifting;Writing;House hold activities;Other (comment)  sleep    Diagnostic tests NCV test to be scheduled   Patient Stated Goals Pt would like the pain to go away.    Currently in Pain? Yes   Pain Score 2   none at rest, 2/10 after exam    Pain Location Elbow   Pain Orientation Right;Left   Pain Descriptors / Indicators Discomfort   Pain Type Chronic pain   Pain Radiating Towards thumb is numb    Pain Onset More than a month ago   Pain Frequency Intermittent   Aggravating Factors  PM , gripping and using her hands, arms    Pain Relieving Factors medicine, recently had injection, ice, wears night splints to help and has an elbow sleeve for work    Effect of Pain on Daily Activities mostly sleep             [facility] PT  Assessment - 10/21/16 0001      Assessment   Medical Diagnosis bilateral tennis elbow    Referring Provider Dr. Roda Shutters    Onset Date/Surgical Date --  4 mos    Hand Dominance Right   Next MD Visit 3 weeks    Prior Therapy not for this issue      Precautions   Precautions None     Restrictions   Weight Bearing Restrictions No     Balance Screen   Has the patient fallen in the past 6 months No     Home Environment   Living Environment Private residence     Prior Function   Level of Independence Independent     Cognition   Overall Cognitive Status Within Functional Limits for tasks assessed     Observation/Other Assessments   Focus on Therapeutic Outcomes (FOTO)  44%     Sensation   Light Touch Appears Intact   Additional Comments reports continuous numbness in thumb      Posture/Postural Control   Posture/Postural Control Postural limitations   Postural Limitations Rounded Shoulders;Forward head     AROM   Right/Left Elbow --  tight elbow extension end range    Right Wrist Extension 42 Degrees   Right Wrist Flexion  50 Degrees   Left Wrist Extension 60 Degrees   Left Wrist Flexion 59 Degrees     Strength   Overall Strength Comments Grip Rt.  38, 39, 40 kg Lt 30, 36, 25 kg    Right/Left Shoulder --  WFL flex and abd    Right Elbow Flexion 5/5   Right Elbow Extension 5/5   Left Elbow Flexion 5/5   Left Elbow Extension 5/5   Right Wrist Flexion 4+/5   Right Wrist Extension 5/5   Left Wrist Flexion 4+/5   Left Wrist Extension 4+/5     Palpation   Palpation comment TTP lateral epicondyle and prox 1/3 forearm bilateral         Objective measurements completed on examination: See above findings.         [facility] Adult PT Treatment/Exercise - 10/21/16 0001      Self-Care   Self-Care Posture;Other Self-Care Comments   Posture upper body, nerve compression   Other Self-Care Comments  HEP, anatomy     Shoulder Exercises: Stretch   Corner Stretch 3 reps;30  seconds     Wrist Exercises   Wrist Flexion Self ROM;Both;5 reps   Wrist Extension Self ROM;Both;5 reps     Iontophoresis   Type of Iontophoresis Dexamethasone   Location bilateral lateral epicondyle    Dose 1 cc    Time 6 hour patch            PT Education - 10/21/16 0856    Education provided Yes   Education Details PT/POC, HEP, posture and stretching, anatomy and differential diagnosis    Person(s) Educated Patient   Methods Explanation;Handout   Comprehension Verbalized understanding;Returned demonstration          PT Short Term Goals - 10/21/16 0903      PT SHORT TERM GOAL #1   Title Pt will be I with stretches/HEP to reduce pain.    Time 3   Period Weeks   Status New     PT SHORT TERM GOAL #2   Title Pt will be able to report up to 15-25% less pain/symptoms in arms, hands to improve comfort at night/sleep hygiene.    Time 3   Period Weeks   Status New           PT Long Term Goals - 10/21/16 0906      PT LONG TERM GOAL #1   Title Pt will be able to score less than 30% impaired on FOTO to reflect functional improvement.    Time 6   Period Weeks   Status New     PT LONG TERM GOAL #2   Title Pt will be I with more advanced HEP for posture and stretching.    Time 6   Period Weeks   Status New     PT LONG TERM GOAL #3   Title Pt will able to sleep with min disturbance only, most nights of the week (75% improved)   Time 6   Period Weeks   Status New     PT LONG TERM GOAL #4   Title Pt will perform grip strength testing with 8 kg improvement in dominant hand.    Time 6   Period Weeks   Status New                Plan - 10/21/16 6962    Clinical Impression Statement Pt presents for low complexity eval of bilateral lateral epicondylitis which began along with wrist/hand pain .  She was diagnosed by primary MD with CTS.  Signs and symptoms are consistent with nerve entrapment and tendonitis.  She has weakened grip, decreasaed muscle  endurance and poor posture.  Occupation contributes to her pain.  She should do well with stretching and modalities to reduce tension in forearm, wrist and in pectorals as well.     Clinical Presentation Stable   Clinical Decision Making Low   Rehab Potential Excellent   PT Frequency 2x / week   PT Duration 6 weeks   PT Treatment/Interventions ADLs/Self Care Home Management;Cryotherapy;Electrical Stimulation;Iontophoresis 4mg /ml Dexamethasone;Moist Heat;Ultrasound;Passive range of motion;Therapeutic exercise;Therapeutic activities;Manual techniques;Patient/family education;Dry needling;Taping   PT Next Visit Plan check HEP, consider TPDN to forearm, wrist ext.  manual repeat ionto? how was it? include time to check CTS special tests    PT Home Exercise Plan wrist flex/ext ROM and corner stretching 3 ways    Consulted and Agree with Plan of Care Patient      Patient will benefit from skilled therapeutic intervention in order to improve the following deficits and impairments:  Increased fascial restricitons, Impaired UE functional use, Pain, Obesity, Impaired sensation, Postural dysfunction, Impaired flexibility, Decreased range of motion  Visit Diagnosis: Pain in left arm  Pain in right arm  Stiffness of left wrist, not elsewhere classified  Stiffness of right wrist, not elsewhere classified  Unspecified disturbances of skin sensation     Problem List Patient Active Problem List   Diagnosis Date Noted  . Diabetes mellitus screening 10/07/2016  . Seasonal allergic rhinitis due to pollen 10/07/2016  . Lateral epicondylitis of both elbows 10/07/2016  . Bilateral carpal tunnel syndrome 10/07/2016  . Left breast lump 10/07/2016  . Secondary amenorrhea 10/07/2016  . HTN (hypertension) 06/24/2016    PAA,JENNIFER 10/21/2016, 9:16 AM  [facility] [address] [place], [place], [address] Phone: [phone]   Fax:   [phone]  Name: Cheryl LollCinthia Manning MRN: [id number] Date of Birth: 08-Nov-1980   Karie MainlandJennifer Paa, PT 10/21/16 9:16 AM Phone: [phone] Fax: [phone]

## 2016-10-23 ENCOUNTER — Telehealth: Payer: Self-pay

## 2016-10-23 NOTE — Telephone Encounter (Signed)
CMA call regarding lab results   Patient did not answer but left a VM stating the reason of the call & to call back  

## 2016-10-23 NOTE — Telephone Encounter (Signed)
-----   Message from Marcine Matareborah B Johnson, MD sent at 10/09/2016 10:33 PM EDT ----- Let pt know that her pap test is abnormal.  I will need to refer her to GYN for further eval and management.  Referral submitted. She also tested positive for bacterial vaginosis.  Not an STD.  Usually due to over growth of normal bacteria in the vagina.  I will send a rxn for antibiotics to [facility] for pick up. Thanks.

## 2016-10-24 NOTE — Telephone Encounter (Signed)
CMA call regarding results  Patient verify DOB  Patient was aware and understood  

## 2016-10-24 NOTE — Telephone Encounter (Signed)
Patient is returning call to nurse.

## 2016-10-29 ENCOUNTER — Ambulatory Visit: Payer: BLUE CROSS/BLUE SHIELD | Admitting: Physical Therapy

## 2016-10-29 DIAGNOSIS — R209 Unspecified disturbances of skin sensation: Secondary | ICD-10-CM

## 2016-10-29 DIAGNOSIS — M79602 Pain in left arm: Secondary | ICD-10-CM | POA: Diagnosis not present

## 2016-10-29 DIAGNOSIS — M25631 Stiffness of right wrist, not elsewhere classified: Secondary | ICD-10-CM

## 2016-10-29 DIAGNOSIS — M25632 Stiffness of left wrist, not elsewhere classified: Secondary | ICD-10-CM

## 2016-10-29 DIAGNOSIS — M79601 Pain in right arm: Secondary | ICD-10-CM

## 2016-10-29 NOTE — Therapy (Signed)
[facility] [address] [place], [place], [address] Phone: [phone]   Fax:  [phone]  Physical Therapy Treatment  Patient Details  Name: Cheryl Manning MRN: [id number] Date of Birth: [birth year] Referring Provider: Dr. Roda Shutters   Encounter Date: 10/29/2016      PT End of Session - 10/29/16 0814    Visit Number 2   Number of Visits 12   Date for PT Re-Evaluation 12/02/16   PT Start Time 0809  pt late    PT Stop Time 0847   PT Time Calculation (min) 38 min   Activity Tolerance Patient tolerated treatment well   Behavior During Therapy [facility] for tasks assessed/performed      No past medical history on file.  No past surgical history on file.  There were no vitals filed for this visit.      Subjective Assessment - 10/29/16 0811    Subjective No new complaints .  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  Pain mostly at night.    Currently in Pain? Yes   Pain Score 2            [facility] Adult PT Treatment/Exercise - 10/29/16 0001      Shoulder Exercises: Stretch   Corner Stretch 3 reps;30 seconds     Wrist Exercises   Forearm Supination Strengthening;Both;20 reps   Bar Weights/Barbell (Forearm Supination) 1 lb   Forearm Pronation Strengthening;Both;20 reps   Bar Weights/Barbell (Forearm Pronation) 1 lb   Wrist Flexion Strengthening;Both;20 reps   Bar Weights/Barbell (Wrist Flexion) 1 lb   Wrist Extension Strengthening;Both;20 reps   Bar Weights/Barbell (Wrist Extension) 1 lb   Wrist Radial Deviation Strengthening;Both;20 reps   Bar Weights/Barbell (Radial Deviation) 1 lb   Wrist Ulnar Deviation Strengthening;Both;20 reps   Bar Weights/Barbell (Ulnar Deviation) 1 lb   Other wrist exercises wrist ROM flex and extension self stretching      Iontophoresis   Type of Iontophoresis Dexamethasone   Location bilateral lateral epicondyle    Dose 1 cc    Time 6  hour patch      Manual Therapy   Manual Therapy Soft tissue mobilization;Myofascial release;Passive ROM   Soft tissue mobilization wrist extensors bilat   Myofascial Release forearms bilat, deep tissue work, deep pressure well tolerated   Passive ROM wrist flexion, ext with elbow ext                   PT Short Term Goals - 10/29/16 1045      PT SHORT TERM GOAL #1   Title Pt will be I with stretches/HEP to reduce pain.    Status On-going     PT SHORT TERM GOAL #2   Title Pt will be able to report up to 15-25% less pain/symptoms in arms, hands to improve comfort at night/sleep hygiene.    Status On-going           PT Long Term Goals - 10/29/16 1046      PT LONG TERM GOAL #1   Title Pt will be able to score less than 30% impaired on FOTO to reflect functional improvement.    Status On-going     PT LONG TERM GOAL #2   Title Pt will be I with more advanced HEP for posture and stretching.    Status On-going     PT LONG TERM GOAL #3   Title Pt will able to sleep with min  disturbance only, most nights of the week (75% improved)   Status On-going     PT LONG TERM GOAL #4   Title Pt will perform grip strength testing with 8 kg improvement in dominant hand.    Status On-going               Plan - 10/29/16 1044    Clinical Impression Statement Pt continues ot have mod to mild pain in L >R elbow.  She has significant tightness in forearm muscles, painful when palpated and with deep tissue work.  She is I with initial HEP.  Open to scheduling for dry needling.    PT Next Visit Plan check HEP, consider TPDN to forearm, wrist ext.  manual repeat ionto? how was it? include time to check CTS special tests    PT Home Exercise Plan wrist flex/ext ROM and corner stretching 3 ways    Consulted and Agree with Plan of Care Patient      Patient will benefit from skilled therapeutic intervention in order to improve the following deficits and impairments:  Increased fascial  restricitons, Impaired UE functional use, Pain, Obesity, Impaired sensation, Postural dysfunction, Impaired flexibility, Decreased range of motion  Visit Diagnosis: Pain in left arm  Pain in right arm  Stiffness of left wrist, not elsewhere classified  Stiffness of right wrist, not elsewhere classified  Unspecified disturbances of skin sensation     Problem List Patient Active Problem List   Diagnosis Date Noted  . Diabetes mellitus screening 10/07/2016  . Seasonal allergic rhinitis due to pollen 10/07/2016  . Lateral epicondylitis of both elbows 10/07/2016  . Bilateral carpal tunnel syndrome 10/07/2016  . Left breast lump 10/07/2016  . Secondary amenorrhea 10/07/2016  . HTN (hypertension) 06/24/2016    Bran Aldridge 10/29/2016, 10:53 AM  [facility] [address] [place], [place], [address] Phone: [phone]   Fax:  [phone]  Name: Cheryl LollCinthia Manning MRN: [id number] Date of Birth: 1981-01-04  Karie MainlandJennifer Lenni Reckner, PT 10/29/16 10:53 AM Phone: [phone] Fax: [phone]

## 2016-11-03 ENCOUNTER — Ambulatory Visit: Payer: BLUE CROSS/BLUE SHIELD | Admitting: Physical Therapy

## 2016-11-03 DIAGNOSIS — R209 Unspecified disturbances of skin sensation: Secondary | ICD-10-CM

## 2016-11-03 DIAGNOSIS — M79602 Pain in left arm: Secondary | ICD-10-CM | POA: Diagnosis not present

## 2016-11-03 DIAGNOSIS — M25631 Stiffness of right wrist, not elsewhere classified: Secondary | ICD-10-CM

## 2016-11-03 DIAGNOSIS — M79601 Pain in right arm: Secondary | ICD-10-CM

## 2016-11-03 DIAGNOSIS — M25632 Stiffness of left wrist, not elsewhere classified: Secondary | ICD-10-CM

## 2016-11-03 NOTE — Therapy (Signed)
[place], [place], [address] Phone: [phone]   Fax:  [phone]  Physical Therapy Treatment  Patient Details  Name: Rucha Wissinger MRN: [id number] Date of Birth: 20-Nov-1980 Referring Provider: Dr. Erlinda Hong   Encounter Date: 11/03/2016      PT End of Session - 11/03/16 0818    Visit Number 3   Number of Visits 12   Date for PT Re-Evaluation 12/02/16   PT Start Time 0804   PT Stop Time 0845   PT Time Calculation (min) 41 min   Activity Tolerance Patient tolerated treatment well   Behavior During Therapy [facility] for tasks assessed/performed      No past medical history on file.  No past surgical history on file.  There were no vitals filed for this visit.      Subjective Assessment - 11/03/16 0808    Subjective Has deep pain today, each elbow.  Last visit helped but pain returned.  Today pain is unusually high, unable to say what may have caused this.    Currently in Pain? Yes   Pain Score 7    Pain Location Elbow   Pain Orientation Right;Left   Pain Type Chronic pain   Pain Onset More than a month ago   Pain Frequency Intermittent   Aggravating Factors  night time, gripping    Pain Relieving Factors meds, ice , elbow sleeve             [facility] Adult PT Treatment/Exercise - 11/03/16 0001      Self-Care   Other Self-Care Comments  printed out a picture of elbow strap for counterforce, also advised agaist Biofreexe AND ice to avoid chemical burn      Elbow Exercises   Wrist Flexion Strengthening;Both;20 reps   Bar Weights/Barbell (Wrist Flexion) 1 lb   Wrist Extension Strengthening;Both;20 reps   Bar Weights/Barbell (Wrist Extension) 1 lb     Shoulder Exercises: ROM/Strengthening   Other ROM/Strengthening Exercises supine chest press 2 lbs x 20 wrist in neutral      Shoulder Exercises: Stretch   Other Shoulder Stretches supine pec stretch added wrist flex/ext      Wrist Exercises    Other wrist exercises circles x 20 each UE and each direction    Other wrist exercises wrist ROM flex and extension self stretching   x 10 flex, ext AROM      Cryotherapy   Number Minutes Cryotherapy 8 Minutes   Cryotherapy Location Other (comment)  elbows   Type of Cryotherapy Ice pack     Iontophoresis   Type of Iontophoresis Dexamethasone   Location bilateral lateral epicondyle    Dose 1 cc    Time 6 hour patch      Manual Therapy   Manual Therapy Soft tissue mobilization;Myofascial release;Passive ROM   Soft tissue mobilization wrist extensors bilat   Myofascial Release forearms bilat, deep tissue work, deep pressure well tolerated   Passive ROM wrist flexion, ext with elbow ext                   PT Short Term Goals - 10/29/16 1045      PT SHORT TERM GOAL #1   Title Pt will be I with stretches/HEP to reduce pain.    Status On-going     PT SHORT TERM GOAL #2   Title Pt will be able to report up to 15-25% less pain/symptoms in arms, hands to improve comfort at night/sleep hygiene.  Status On-going           PT Long Term Goals - 10/29/16 1046      PT LONG TERM GOAL #1   Title Pt will be able to score less than 30% impaired on FOTO to reflect functional improvement.    Status On-going     PT LONG TERM GOAL #2   Title Pt will be I with more advanced HEP for posture and stretching.    Status On-going     PT LONG TERM GOAL #3   Title Pt will able to sleep with min disturbance only, most nights of the week (75% improved)   Status On-going     PT LONG TERM GOAL #4   Title Pt will perform grip strength testing with 8 kg improvement in dominant hand.    Status On-going               Plan - 11/03/16 0847    Clinical Impression Statement Pt with increased pain from working all weekend. Pain easy to localize at wrist extensor origin.  No further goals met.    PT Next Visit Plan check HEP, consider TPDN to forearm, wrist ext.  manual repeat  ionto   PT Home Exercise Plan wrist flex/ext ROM and corner stretching 3 ways    Consulted and Agree with Plan of Care Patient      Patient will benefit from skilled therapeutic intervention in order to improve the following deficits and impairments:  Increased fascial restricitons, Impaired UE functional use, Pain, Obesity, Impaired sensation, Postural dysfunction, Impaired flexibility, Decreased range of motion  Visit Diagnosis: Pain in left arm  Pain in right arm  Stiffness of left wrist, not elsewhere classified  Stiffness of right wrist, not elsewhere classified  Unspecified disturbances of skin sensation     Problem List Patient Active Problem List   Diagnosis Date Noted  . Diabetes mellitus screening 10/07/2016  . Seasonal allergic rhinitis due to pollen 10/07/2016  . Lateral epicondylitis of both elbows 10/07/2016  . Bilateral carpal tunnel syndrome 10/07/2016  . Left breast lump 10/07/2016  . Secondary amenorrhea 10/07/2016  . HTN (hypertension) 06/24/2016    Rowan Blaker 11/03/2016, 8:48 AM  [facility] [address] [place], [place], [address] Phone: [phone]   Fax:  [phone]  Name: Alera Quevedo MRN: [id number] Date of Birth: 1980/06/13  Raeford Razor, PT 11/03/16 8:48 AM Phone: [phone] Fax: [phone]

## 2016-11-05 ENCOUNTER — Ambulatory Visit: Payer: BLUE CROSS/BLUE SHIELD | Admitting: Physical Therapy

## 2016-11-05 ENCOUNTER — Encounter: Payer: Self-pay | Admitting: Physical Therapy

## 2016-11-05 DIAGNOSIS — M79602 Pain in left arm: Secondary | ICD-10-CM | POA: Diagnosis not present

## 2016-11-05 DIAGNOSIS — M79601 Pain in right arm: Secondary | ICD-10-CM

## 2016-11-05 DIAGNOSIS — M25632 Stiffness of left wrist, not elsewhere classified: Secondary | ICD-10-CM

## 2016-11-05 DIAGNOSIS — R209 Unspecified disturbances of skin sensation: Secondary | ICD-10-CM

## 2016-11-05 DIAGNOSIS — M25631 Stiffness of right wrist, not elsewhere classified: Secondary | ICD-10-CM

## 2016-11-05 NOTE — Therapy (Signed)
[facility] [address] [place], [place], [address] Phone: [phone]   Fax:  [phone]  Physical Therapy Treatment  Patient Details  Name: Cheryl Manning MRN: [id number] Date of Birth: 1981/04/13 Referring Provider: Dr. Roda ShuttersXu   Encounter Date: 11/05/2016      PT End of Session - 11/05/16 0937    Visit Number 4   Number of Visits 12   Date for PT Re-Evaluation 12/02/16   PT Start Time 0933   PT Stop Time 1018   PT Time Calculation (min) 45 min   Activity Tolerance Patient tolerated treatment well   Behavior During Therapy [facility] for tasks assessed/performed      History reviewed. No pertinent past medical history.  No past surgical history on file.  There were no vitals filed for this visit.      Subjective Assessment - 11/05/16 0931    Subjective "I Dont have pain, it just bothers me some in the joint with moving my hands to 4-5/10"    Currently in Pain? Yes   Pain Score 5    Pain Location Elbow   Pain Orientation Right;Left   Pain Onset More than a month ago   Pain Frequency Constant   Aggravating Factors  gripping, holding, lifting items                         [facility] Adult PT Treatment/Exercise - 11/05/16 0001      Wrist Exercises   Other wrist exercises wrist flexor stretching PNF 2 x 30 sec bil  contract/ relax with 10 sec contraction.      Iontophoresis   Type of Iontophoresis Dexamethasone   Location bilateral medial epicondyle    Dose 1 cc    Time 6 hour patch      Manual Therapy   Soft tissue mobilization Iastm over wrist flexors/ pronators   Myofascial Release forearms bilat, deep tissue work, deep pressure well tolerated   Passive ROM wrist flexion, ext with elbow ext           Trigger Point Dry Needling - 11/05/16 0935    Consent Given? Yes   Education Handout Provided Yes   Muscles Treated Upper Body --  pronator teres, and common flexor bil              PT Education - 11/05/16 0936    Education provided Yes   Education Details muscle anatomy and referral patterns. what TPDN is, benefits/ what to expect and after care.  extra time needed for interpretation   Person(s) Educated Patient   Methods Explanation;Verbal cues;Handout   Comprehension Verbalized understanding;Verbal cues required          PT Short Term Goals - 10/29/16 1045      PT SHORT TERM GOAL #1   Title Pt will be I with stretches/HEP to reduce pain.    Status On-going     PT SHORT TERM GOAL #2   Title Pt will be able to report up to 15-25% less pain/symptoms in arms, hands to improve comfort at night/sleep hygiene.    Status On-going           PT Long Term Goals - 10/29/16 1046      PT LONG TERM GOAL #1   Title Pt will be able to score less than 30% impaired on FOTO to reflect functional improvement.    Status On-going     PT LONG TERM GOAL #2  Title Pt will be I with more advanced HEP for posture and stretching.    Status On-going     PT LONG TERM GOAL #3   Title Pt will able to sleep with min disturbance only, most nights of the week (75% improved)   Status On-going     PT LONG TERM GOAL #4   Title Pt will perform grip strength testing with 8 kg improvement in dominant hand.    Status On-going               Plan - 11/05/16 1058    Clinical Impression Statement pt reported improvment of pain since last session but reported pain inthe medial epicondyle bil. TPDN was explained and performed on bil pronators and common wrist flexors. IASTM techniiques and stretch were utilized following TPDN which she reported relief of pain and tightness. conitnued ionto on bil elbows.    PT Next Visit Plan assess response to TPDN. check HEP, consider TPDN to forearm, wrist ext.  manual repeat ionto   PT Home Exercise Plan wrist flex/ext ROM and corner stretching 3 ways    Consulted and Agree with Plan of Care Patient      Patient will benefit from skilled  therapeutic intervention in order to improve the following deficits and impairments:     Visit Diagnosis: Pain in left arm  Pain in right arm  Stiffness of right wrist, not elsewhere classified  Stiffness of left wrist, not elsewhere classified  Unspecified disturbances of skin sensation     Problem List Patient Active Problem List   Diagnosis Date Noted  . Diabetes mellitus screening 10/07/2016  . Seasonal allergic rhinitis due to pollen 10/07/2016  . Lateral epicondylitis of both elbows 10/07/2016  . Bilateral carpal tunnel syndrome 10/07/2016  . Left breast lump 10/07/2016  . Secondary amenorrhea 10/07/2016  . HTN (hypertension) 06/24/2016   Lulu Riding PT, DPT, LAT, ATC  11/05/16  11:02 AM      [facility] [address] [place], [place], [address] Phone: [phone]   Fax:  [phone]  Name: Cheryl Manning MRN: [id number] Date of Birth: [birth year]

## 2016-11-10 ENCOUNTER — Encounter: Payer: Self-pay | Admitting: Obstetrics & Gynecology

## 2016-11-10 DIAGNOSIS — R87610 Atypical squamous cells of undetermined significance on cytologic smear of cervix (ASC-US): Secondary | ICD-10-CM | POA: Insufficient documentation

## 2016-11-10 DIAGNOSIS — R87810 Cervical high risk human papillomavirus (HPV) DNA test positive: Secondary | ICD-10-CM

## 2016-11-10 MED FILL — ?CETIRIZINE HCL 10 MG TABLE: 10 | 30 days supply | Qty: 30 | Fill #1

## 2016-11-11 ENCOUNTER — Ambulatory Visit: Payer: BLUE CROSS/BLUE SHIELD | Admitting: Physical Therapy

## 2016-11-11 ENCOUNTER — Encounter: Payer: Self-pay | Admitting: Physical Therapy

## 2016-11-11 DIAGNOSIS — M25631 Stiffness of right wrist, not elsewhere classified: Secondary | ICD-10-CM

## 2016-11-11 DIAGNOSIS — M79602 Pain in left arm: Secondary | ICD-10-CM | POA: Diagnosis not present

## 2016-11-11 DIAGNOSIS — R209 Unspecified disturbances of skin sensation: Secondary | ICD-10-CM

## 2016-11-11 DIAGNOSIS — M25632 Stiffness of left wrist, not elsewhere classified: Secondary | ICD-10-CM

## 2016-11-11 DIAGNOSIS — M79601 Pain in right arm: Secondary | ICD-10-CM

## 2016-11-11 NOTE — Therapy (Signed)
[place], [place], [address] Phone: [phone]   Fax:  [phone]  Physical Therapy Treatment  Patient Details  Name: Cheryl Manning MRN: [id number] Date of Birth: Apr 29, 1980 Referring Provider: Dr. Erlinda Hong   Encounter Date: 11/11/2016      PT End of Session - 11/11/16 0929    Visit Number 5   Number of Visits 12   Date for PT Re-Evaluation 12/02/16   PT Start Time 0803   PT Stop Time 0848   PT Time Calculation (min) 45 min   Activity Tolerance Patient tolerated treatment well   Behavior During Therapy [facility] for tasks assessed/performed      History reviewed. No pertinent past medical history.  History reviewed. No pertinent surgical history.  There were no vitals filed for this visit.      Subjective Assessment - 11/11/16 0808    Subjective No pain, only numbness in the last 3 fingers of Rt. hand.  None in L hand.     Currently in Pain? No/denies             [facility] Adult PT Treatment/Exercise - 11/11/16 0001      Elbow Exercises   Wrist Flexion Strengthening;Both;20 reps   Bar Weights/Barbell (Wrist Flexion) 2 lbs   Wrist Extension Strengthening;Both;20 reps   Bar Weights/Barbell (Wrist Extension) 2 lbs     Shoulder Exercises: ROM/Strengthening   UBE (Upper Arm Bike) level 1 UBE 2 min forward, 3 min in reverse      Shoulder Exercises: Stretch   Corner Stretch 3 reps;30 seconds   Other Shoulder Stretches wall stretches for bilateral      Wrist Exercises   Other wrist exercises wrist flexor and extensor stretching PNF 2 x 30 sec bil  contract/ relax with 10 sec contraction.      Iontophoresis   Type of Iontophoresis Dexamethasone   Location bilateral medial epicondyle    Dose 1 cc    Time 6 hour patch      Manual Therapy   Soft tissue mobilization Iastm over wrist flexors/ pronators   Myofascial Release forearms bilat, deep tissue work, deep pressure well tolerated    Passive ROM wrist flexion, ext with elbow ext                   PT Short Term Goals - 11/11/16 2035      PT SHORT TERM GOAL #1   Title Pt will be I with stretches/HEP to reduce pain.    Status Achieved     PT SHORT TERM GOAL #2   Title Pt will be able to report up to 15-25% less pain/symptoms in arms, hands to improve comfort at night/sleep hygiene.    Baseline can be uncomfortable at times, it hurts when I move a certain way    Status Achieved           PT Long Term Goals - 11/11/16 5974      PT LONG TERM GOAL #1   Title Pt will be able to score less than 30% impaired on FOTO to reflect functional improvement.    Status Unable to assess     PT LONG TERM GOAL #2   Title Pt will be I with more advanced HEP for posture and stretching.    Status On-going     PT LONG TERM GOAL #3   Title Pt will able to sleep with min disturbance only, most nights of the week (75% improved)  Baseline 50-75%    Status Partially Met     PT LONG TERM GOAL #4   Title Pt will perform grip strength testing with 8 kg improvement in dominant hand.    Status Unable to assess               Plan - 11/11/16 0930    Clinical Impression Statement Pt wiht mostly just numbness in lateral 3 digits of Rt. UE.  She had an increase in pain after dry needling but understands it was beneficial.  Goals in progress, of note, sleep and comfort at night 50-75% improved.    PT Next Visit Plan TPDN, cont manual and stretching, ionto    PT Home Exercise Plan wrist flex/ext ROM and corner stretching 3 ways    Consulted and Agree with Plan of Care Patient      Patient will benefit from skilled therapeutic intervention in order to improve the following deficits and impairments:     Visit Diagnosis: Pain in left arm  Pain in right arm  Stiffness of right wrist, not elsewhere classified  Unspecified disturbances of skin sensation  Stiffness of left wrist, not elsewhere  classified     Problem List Patient Active Problem List   Diagnosis Date Noted  . ASCUS with positive high risk HPV cervical 11/10/2016  . Diabetes mellitus screening 10/07/2016  . Seasonal allergic rhinitis due to pollen 10/07/2016  . Lateral epicondylitis of both elbows 10/07/2016  . Bilateral carpal tunnel syndrome 10/07/2016  . Left breast lump 10/07/2016  . Secondary amenorrhea 10/07/2016  . HTN (hypertension) 06/24/2016    PAA,JENNIFER 11/11/2016, 9:31 AM  [facility] [address] [place], [place], [address] Phone: [phone]   Fax:  [phone]  Name: Frankie Zito MRN: [id number] Date of Birth: [birth year]   Raeford Razor, PT 11/11/16 9:32 AM Phone: [phone] Fax: [phone]

## 2016-11-13 ENCOUNTER — Ambulatory Visit: Payer: BLUE CROSS/BLUE SHIELD | Admitting: Physical Therapy

## 2016-11-13 ENCOUNTER — Encounter: Payer: Self-pay | Admitting: Physical Therapy

## 2016-11-13 DIAGNOSIS — M25632 Stiffness of left wrist, not elsewhere classified: Secondary | ICD-10-CM

## 2016-11-13 DIAGNOSIS — R209 Unspecified disturbances of skin sensation: Secondary | ICD-10-CM

## 2016-11-13 DIAGNOSIS — M79602 Pain in left arm: Secondary | ICD-10-CM

## 2016-11-13 DIAGNOSIS — M25631 Stiffness of right wrist, not elsewhere classified: Secondary | ICD-10-CM

## 2016-11-13 DIAGNOSIS — M79601 Pain in right arm: Secondary | ICD-10-CM

## 2016-11-13 NOTE — Therapy (Addendum)
[place], [place], [address] Phone: [phone]   Fax:  [phone]  Physical Therapy Treatment  Patient Details  Name: Chelisa Hennen MRN: [id number] Date of Birth: [birth year] Referring Provider: Dr. Erlinda Hong   Encounter Date: 11/13/2016      PT End of Session - 11/13/16 0806    Visit Number 6   Number of Visits 12   Date for PT Re-Evaluation 12/02/16   PT Start Time 0805   PT Stop Time 0848   PT Time Calculation (min) 43 min   Activity Tolerance Patient tolerated treatment well   Behavior During Therapy [facility] for tasks assessed/performed      History reviewed. No pertinent past medical history.  History reviewed. No pertinent surgical history.  There were no vitals filed for this visit.      Subjective Assessment - 11/13/16 0809    Subjective " only have 2/10 pain in bil forearms"   Currently in Pain? Yes   Pain Score 2    Pain Location Elbow   Aggravating Factors  gripping, holding, lifting items   Pain Relieving Factors meds, ice, elbow                         [facility] Adult PT Treatment/Exercise - 11/13/16 0829      Elbow Exercises   Wrist Flexion Strengthening;Both;20 reps   Bar Weights/Barbell (Wrist Flexion) 2 lbs   Wrist Extension Strengthening;Both;20 reps   Bar Weights/Barbell (Wrist Extension) 2 lbs     Shoulder Exercises: ROM/Strengthening   UBE (Upper Arm Bike) level 1 UBE 2 min forward, 2 min in reverse      Wrist Exercises   Other wrist exercises tendon glides for the wrist/ hand 2 x 10 with 5 sec hold     Iontophoresis   Type of Iontophoresis Dexamethasone   Location bilateral medial epicondyle    Dose 1 cc    Time 6 hour patch      Manual Therapy   Manual therapy comments active release techniques over the bil wrist flexors.   Soft tissue mobilization Iastm over wrist flexors/ pronators   Passive ROM wrist flexion, ext with elbow ext            Trigger Point Dry Needling - 11/13/16 0817    Consent Given? Yes   Education Handout Provided No  given previously   Muscles Treated Upper Body --  bil pronator teres, common wrist flexors                PT Short Term Goals - 11/11/16 0819      PT SHORT TERM GOAL #1   Title Pt will be I with stretches/HEP to reduce pain.    Status Achieved     PT SHORT TERM GOAL #2   Title Pt will be able to report up to 15-25% less pain/symptoms in arms, hands to improve comfort at night/sleep hygiene.    Baseline can be uncomfortable at times, it hurts when I move a certain way    Status Achieved           PT Long Term Goals - 11/11/16 5809      PT LONG TERM GOAL #1   Title Pt will be able to score less than 30% impaired on FOTO to reflect functional improvement.    Status Unable to assess     PT LONG TERM GOAL #2   Title Pt will be  I with more advanced HEP for posture and stretching.    Status On-going     PT LONG TERM GOAL #3   Title Pt will able to sleep with min disturbance only, most nights of the week (75% improved)   Baseline 50-75%    Status Partially Met     PT LONG TERM GOAL #4   Title Pt will perform grip strength testing with 8 kg improvement in dominant hand.    Status Unable to assess               Plan - 11/13/16 1025    Clinical Impression Statement pt reported decrease pain and tighness in the wrist/ elbow. continued TPDN over bil wrist flexors. soft tissue and stretching techniques performed follwing DN which she reported significant relief of pain and tightness. she performed exercises given with no report of pain, updated HEP for tendon glides. continued Ionto to continue pain relief.    PT Next Visit Plan TPDN, cont manual and stretching, ionto    Consulted and Agree with Plan of Care Patient      Patient will benefit from skilled therapeutic intervention in order to improve the following deficits and impairments:  Increased fascial  restricitons, Impaired UE functional use, Pain, Obesity, Impaired sensation, Postural dysfunction, Impaired flexibility, Decreased range of motion  Visit Diagnosis: Pain in left arm  Pain in right arm  Stiffness of right wrist, not elsewhere classified  Unspecified disturbances of skin sensation  Stiffness of left wrist, not elsewhere classified     Problem List Patient Active Problem List   Diagnosis Date Noted  . ASCUS with positive high risk HPV cervical 11/10/2016  . Diabetes mellitus screening 10/07/2016  . Seasonal allergic rhinitis due to pollen 10/07/2016  . Lateral epicondylitis of both elbows 10/07/2016  . Bilateral carpal tunnel syndrome 10/07/2016  . Left breast lump 10/07/2016  . Secondary amenorrhea 10/07/2016  . HTN (hypertension) 06/24/2016   Starr Lake PT, DPT, LAT, ATC  11/13/16  12:57 PM      [facility] [address] [place], [place], [address] Phone: [phone]   Fax:  [phone]  Name: Roselie Cirigliano MRN: [id number] Date of Birth: 1981/02/22

## 2016-11-14 MED FILL — LOSARTAN-HCTZ 100-12.5 MG T: 100-12.5 | 30 days supply | Qty: 30 | Fill #5

## 2016-11-14 MED FILL — ?METFORMIN HCL 500MG TABLET: 500 | 30 days supply | Qty: 30 | Fill #1

## 2016-11-17 ENCOUNTER — Ambulatory Visit: Payer: BLUE CROSS/BLUE SHIELD | Admitting: Physical Therapy

## 2016-11-17 DIAGNOSIS — M79602 Pain in left arm: Secondary | ICD-10-CM

## 2016-11-17 DIAGNOSIS — M25632 Stiffness of left wrist, not elsewhere classified: Secondary | ICD-10-CM

## 2016-11-17 DIAGNOSIS — M25631 Stiffness of right wrist, not elsewhere classified: Secondary | ICD-10-CM

## 2016-11-17 DIAGNOSIS — R209 Unspecified disturbances of skin sensation: Secondary | ICD-10-CM

## 2016-11-17 DIAGNOSIS — M79601 Pain in right arm: Secondary | ICD-10-CM

## 2016-11-17 NOTE — Therapy (Signed)
[facility] [address] [place], [place], [address] Phone: [phone]   Fax:  [phone]  Physical Therapy Treatment  Patient Details  Name: Cheryl Manning MRN: [id number] Date of Birth: [birth year] Referring Provider: Dr. Roda ShuttersXu   Encounter Date: 11/17/2016      PT End of Session - 11/17/16 0805    Visit Number 7   Number of Visits 12   Date for PT Re-Evaluation 12/02/16   PT Start Time 0800   PT Stop Time 0847   PT Time Calculation (min) 47 min   Activity Tolerance Patient tolerated treatment well   Behavior [facility] for tasks assessed/performed      No past medical history on file.  No past surgical history on file.  There were no vitals filed for this visit.      Subjective Assessment - 11/17/16 0808    Subjective No pain, just my fingers are numb in Rt. hand. Pt has one more appt after today, she thinks she will be OK for DC next visit.    Currently in Pain? No/denies            [facility] - 11/17/16 0001      Strength   Overall Strength Comments Rt. 40, 48, 44 and Lt 33, 30, 35            [facility] Adult PT Treatment/Exercise - 11/17/16 0001      Shoulder Exercises: ROM/Strengthening   UBE (Upper Arm Bike) level 1 UBE 5 min in reverse    Other ROM/Strengthening Exercises standing postural strength with green band and ball: retraction, horiz abd and ER/IR unattached   Other ROM/Strengthening Exercises also overhead reach, cues to avoid scap protraction     Shoulder Exercises: Stretch   Corner Stretch 3 reps;30 seconds   Other Shoulder Stretches wall stretches for bilateral anterior shoulder (abd/ER)      Wrist Exercises   Other wrist exercises tendon glides for the wrist/ hand 2 x 10 with 5 sec hold   Other wrist exercises wrist flexor and extensior stretching for 30 sec x 3 each direction      Iontophoresis   Type of Iontophoresis Dexamethasone   Location bilateral  medial epicondyle    Dose 1 cc    Time 6 hour patch      Manual Therapy   Myofascial Release forearms bilat, deep tissue work, deep pressure well tolerated   Passive ROM wrist flexion, ext with elbow ext                   PT Short Term Goals - 11/11/16 [id number]      PT SHORT TERM GOAL #1   Title Pt will be I with stretches/HEP to reduce pain.    Status Achieved     PT SHORT TERM GOAL #2   Title Pt will be able to report up to 15-25% less pain/symptoms in arms, hands to improve comfort at night/sleep hygiene.    Baseline can be uncomfortable at times, it hurts when I move a certain way    Status Achieved           PT Long Term Goals - 11/17/16 0827      PT LONG TERM GOAL #1   Title Pt will be able to score less than 30% impaired on FOTO to reflect functional improvement.    Status Unable to assess     PT LONG TERM GOAL #2   Title Pt  will be I with more advanced HEP for posture and stretching.    Status Achieved     PT LONG TERM GOAL #3   Title Pt will able to sleep with min disturbance only, most nights of the week (75% improved)   Status Achieved     PT LONG TERM GOAL #4   Title Pt will perform grip strength testing with 8 kg improvement in dominant hand.    Baseline about 4 kg avg each UE    Status Achieved               Plan - 11/17/16 0939    Clinical Impression Statement Pt with no pain today, only when palpated at medial epicondyle bilaterally did she have pain.  She had a mild skin irritation on forearm which she said was from the patches.  She wanted me to reapply anyway,as she feels they help her.  She feels ready to be discharged after next visit.    PT Next Visit Plan TPDN, cont manual and stretching, ionto discharge, FOto?   PT Home Exercise Plan wrist flex/ext ROM and corner stretching 3 ways, tendon glides    Consulted and Agree with Plan of Care Patient      Patient will benefit from skilled therapeutic intervention in order to improve  the following deficits and impairments:  Increased fascial restricitons, Impaired UE functional use, Pain, Obesity, Impaired sensation, Postural dysfunction, Impaired flexibility, Decreased range of motion  Visit Diagnosis: Pain in left arm  Pain in right arm  Stiffness of right wrist, not elsewhere classified  Unspecified disturbances of skin sensation  Stiffness of left wrist, not elsewhere classified     Problem List Patient Active Problem List   Diagnosis Date Noted  . ASCUS with positive high risk HPV cervical 11/10/2016  . Diabetes mellitus screening 10/07/2016  . Seasonal allergic rhinitis due to pollen 10/07/2016  . Lateral epicondylitis of both elbows 10/07/2016  . Bilateral carpal tunnel syndrome 10/07/2016  . Left breast lump 10/07/2016  . Secondary amenorrhea 10/07/2016  . HTN (hypertension) 06/24/2016    Joliyah Lippens 11/17/2016, 9:41 AM  [facility] [address] [place], [place], [address] Phone: [phone]   Fax:  [phone]  Name: Cheryl Manning MRN: [id number] Date of Birth: 1980/06/15  Karie MainlandJennifer Selyna Klahn, PT 11/17/16 9:41 AM Phone: [phone] Fax: [phone]

## 2016-11-19 ENCOUNTER — Encounter: Payer: Self-pay | Admitting: Physical Therapy

## 2016-11-19 ENCOUNTER — Ambulatory Visit: Payer: BLUE CROSS/BLUE SHIELD | Attending: Orthopaedic Surgery | Admitting: Physical Therapy

## 2016-11-19 DIAGNOSIS — R209 Unspecified disturbances of skin sensation: Secondary | ICD-10-CM | POA: Insufficient documentation

## 2016-11-19 DIAGNOSIS — M25632 Stiffness of left wrist, not elsewhere classified: Secondary | ICD-10-CM | POA: Insufficient documentation

## 2016-11-19 DIAGNOSIS — M79601 Pain in right arm: Secondary | ICD-10-CM | POA: Diagnosis present

## 2016-11-19 DIAGNOSIS — M25631 Stiffness of right wrist, not elsewhere classified: Secondary | ICD-10-CM | POA: Insufficient documentation

## 2016-11-19 DIAGNOSIS — M79602 Pain in left arm: Secondary | ICD-10-CM | POA: Insufficient documentation

## 2016-11-19 NOTE — Therapy (Signed)
[place], [place], [address] Phone: [phone]   Fax:  [phone]  Physical Therapy Treatment / Discharge Summary  Patient Details  Name: Cheryl Manning MRN: [id number] Date of Birth: [birth year] Referring Provider: Dr. Erlinda Hong   Encounter Date: 11/19/2016      PT End of Session - 11/19/16 0843    Visit Number 8   Number of Visits 12   Date for PT Re-Evaluation 12/02/16   PT Start Time 0804   PT Stop Time 0845   PT Time Calculation (min) 41 min   Activity Tolerance Patient tolerated treatment well   Behavior During Therapy [facility] for tasks assessed/performed      History reviewed. No pertinent past medical history.  History reviewed. No pertinent surgical history.  There were no vitals filed for this visit.      Subjective Assessment - 11/19/16 0804    Subjective "no pain today really, still having some numbness and tingling in the R hand only.    Currently in Pain? No/denies                         [facility] Adult PT Treatment/Exercise - 11/19/16 0842      Wrist Exercises   Other wrist exercises tendon glides for the wrist/ hand 2 x 10 with 5 sec hold   Other wrist exercises wrist flexor contract/ relax stretching bil 3 x 30 sec with 10 sec contraction     Iontophoresis   Type of Iontophoresis Dexamethasone   Location bilateral medial epicondyle    Dose 55m/ml   Time 6 hour patch   x2     Manual Therapy   Manual therapy comments active release techniques over the bil wrist flexors.   Soft tissue mobilization Iastm over wrist flexors/ pronators   Myofascial Release forearms bilat, deep tissue work, deep pressure well tolerated   Passive ROM wrist flexion, ext with elbow ext           Trigger Point Dry Needling - 11/19/16 06378   Consent Given? Yes   Education Handout Provided No  given previously   Muscles Treated Upper Body --  bil common flexors and pronator  teres              PT Education - 11/19/16 0843    Education provided Yes   Education Details reviewed previous HEP and benefits continued stretching and exercise to maintain and promote current level of function and pain relief.   Person(s) Educated Patient   Methods Explanation;Verbal cues;Handout   Comprehension Verbalized understanding;Verbal cues required          PT Short Term Goals - 11/11/16 0819      PT SHORT TERM GOAL #1   Title Pt will be I with stretches/HEP to reduce pain.    Status Achieved     PT SHORT TERM GOAL #2   Title Pt will be able to report up to 15-25% less pain/symptoms in arms, hands to improve comfort at night/sleep hygiene.    Baseline can be uncomfortable at times, it hurts when I move a certain way    Status Achieved           PT Long Term Goals - 11/19/16 0848      PT LONG TERM GOAL #1   Title Pt will be able to score less than 30% impaired on FOTO to reflect functional improvement.    Time 6  Period Weeks   Status Achieved     PT LONG TERM GOAL #2   Title Pt will be I with more advanced HEP for posture and stretching.    Time 6   Period Weeks   Status Achieved     PT LONG TERM GOAL #3   Title Pt will able to sleep with min disturbance only, most nights of the week (75% improved)   Time 6   Period Weeks   Status Achieved     PT LONG TERM GOAL #4   Title Pt will perform grip strength testing with 8 kg improvement in dominant hand.    Time 6   Period Weeks   Status Achieved               Plan - 11/19/16 0845    Clinical Impression Statement pt reports no pain in the elbows with only some tightness and N/T in the R hand only today. Continued TPDN over bil common wrist flexors and pronator teres. continued soft tissue and active release techniques which she performed well. She has made great progress with physical therapy meeting all goals, improving pain and tightness and reduced N/T in bil hands. She is able to  maintain and progress her current level of function independently and will be discharged from PT today.    PT Next Visit Plan D/C   PT Home Exercise Plan wrist flex/ext ROM and corner stretching 3 ways, tendon glides    Consulted and Agree with Plan of Care Patient      Patient will benefit from skilled therapeutic intervention in order to improve the following deficits and impairments:  Increased fascial restricitons, Impaired UE functional use, Pain, Obesity, Impaired sensation, Postural dysfunction, Impaired flexibility, Decreased range of motion  Visit Diagnosis: Pain in left arm  Pain in right arm  Stiffness of right wrist, not elsewhere classified  Unspecified disturbances of skin sensation  Stiffness of left wrist, not elsewhere classified     Problem List Patient Active Problem List   Diagnosis Date Noted  . ASCUS with positive high risk HPV cervical 11/10/2016  . Diabetes mellitus screening 10/07/2016  . Seasonal allergic rhinitis due to pollen 10/07/2016  . Lateral epicondylitis of both elbows 10/07/2016  . Bilateral carpal tunnel syndrome 10/07/2016  . Left breast lump 10/07/2016  . Secondary amenorrhea 10/07/2016  . HTN (hypertension) 06/24/2016   Starr Lake PT, DPT, LAT, ATC  11/19/16  8:49 AM      [facility] [address] [place], [place], [address] Phone: [phone]   Fax:  [phone]  Name: Cheryl Manning MRN: [id number] Date of Birth: March 26, 1981       PHYSICAL THERAPY DISCHARGE SUMMARY  Visits from Start of Care: 8  Current functional level related to goals / functional outcomes: See goals, FOTO 31% limited   Remaining deficits: Intermittent tightness in bil wrist flexors. Decreased N/T in the R hand and intermittent N/T noted in the L.    Education / Equipment: HEP, posture/ lifting and carrying mechanics. Theraband.   Plan: Patient agrees to discharge.  Patient  goals were met. Patient is being discharged due to meeting the stated rehab goals.  ?????    Angline Schweigert PT, DPT, LAT, ATC  11/19/16  8:51 AM

## 2016-12-04 ENCOUNTER — Ambulatory Visit (INDEPENDENT_AMBULATORY_CARE_PROVIDER_SITE_OTHER): Payer: BLUE CROSS/BLUE SHIELD | Admitting: Obstetrics & Gynecology

## 2016-12-04 ENCOUNTER — Other Ambulatory Visit (HOSPITAL_COMMUNITY)
Admission: RE | Admit: 2016-12-04 | Discharge: 2016-12-04 | Disposition: A | Discharge status: Home / Self Care | Payer: BLUE CROSS/BLUE SHIELD | Source: Ambulatory Visit | Attending: Obstetrics & Gynecology | Admitting: Obstetrics & Gynecology

## 2016-12-04 VITALS — BP 146/87 | HR 87 | Wt 271.8 lb

## 2016-12-04 DIAGNOSIS — R87610 Atypical squamous cells of undetermined significance on cytologic smear of cervix (ASC-US): Secondary | ICD-10-CM

## 2016-12-04 DIAGNOSIS — R87810 Cervical high risk human papillomavirus (HPV) DNA test positive: Secondary | ICD-10-CM | POA: Diagnosis not present

## 2016-12-04 LAB — POCT PREGNANCY, URINE: PREG TEST UR: NEGATIVE

## 2016-12-04 MED FILL — FLUTICASONE PROP 50 MCG SPR: 50 | 30 days supply | Qty: 16 | Fill #1

## 2016-12-04 NOTE — Progress Notes (Signed)
Patient ID: Cheryl Manning, female   DOB: [birth year], 36 y.o.   MRN: [id number]  Chief Complaint  Patient presents with  . Colposcopy    HPI Cheryl Manning is a 36 y.o. female.  No obstetric history on file. G0 No LMP recorded. LMP current HPI  Indications: Pap smear on June 2018 showed: ASCUS with POSITIVE high risk HPV. Previous colposcopy: none . Prior cervical treatment: no treatment.  No past medical history on file.  No past surgical history on file.  Family History  Problem Relation Age of Onset  . Diabetes Father   . Hypertension Father   . Uterine cancer Sister   . Hypertension Brother   . Drug abuse Paternal Uncle   . Uterine cancer Maternal Grandmother   . Uterine cancer Paternal Grandmother     Social History Social History  Substance Use Topics  . Smoking status: Former Games developermoker  . Smokeless tobacco: Never Used  . Alcohol use Not on file    Allergies  Allergen Reactions  . Cinnamon Anaphylaxis    Current Outpatient Prescriptions  Medication Sig Dispense Refill  . cetirizine (ZYRTEC) 10 MG tablet Take 1 tablet (10 mg total) by mouth daily. 30 tablet 6  . fluticasone (FLONASE) 50 MCG/ACT nasal spray Place 2 sprays into both nostrils daily. 16 g 6  . losartan-hydrochlorothiazide (HYZAAR) 100-12.5 MG tablet Take 1 tablet by mouth daily. 90 tablet 1  . medroxyPROGESTERone (PROVERA) 10 MG tablet Take one tab daily for 7 days the third week of every month to induce menstrual periods 7 tablet 6  . metFORMIN (GLUCOPHAGE) 500 MG tablet Take 1 tablet (500 mg total) by mouth daily. 180 tablet 3  . diclofenac sodium (VOLTAREN) 1 % GEL Apply 2 g topically 4 (four) times daily. (Patient not taking: Reported on 12/04/2016) 100 g 2  . metroNIDAZOLE (FLAGYL) 500 MG tablet Take 1 tablet (500 mg total) by mouth 3 (three) times daily. (Patient not taking: Reported on 12/04/2016) 21 tablet 0  . pseudoephedrine (SUDAFED) 30 MG tablet Take 1 tablet (30 mg total) by  mouth every 8 (eight) hours as needed for congestion. (Patient not taking: Reported on 12/04/2016) 12 tablet 0   No current facility-administered medications for this visit.     Review of Systems Review of Systems  Blood pressure (!) 146/87, pulse 87, weight 271 lb 12.8 oz (123.3 kg).  Physical Exam Physical Exam  Data Reviewed Pap result  Assessment    Procedure Details  The risks and benefits of the procedure and Written informed consent obtained. Time out performed Speculum placed in vagina and excellent visualization of cervix achieved using long speculum, cervix swabbed x 3 with acetic acid solution. 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     Plan    Specimens labelled and sent to Pathology. Call to discuss Pathology results in 2 weeks.      Cheryl Manning 12/04/2016, 12:13 PM

## 2016-12-04 NOTE — Patient Instructions (Signed)
Colposcopía - Cuidados posteriores  (Colposcopy, Care After)  Siga estas instrucciones durante las próximas semanas. Estas indicaciones le proporcionan información general acerca de cómo deberá cuidarse después del procedimiento. El médico también podrá darle instrucciones más específicas. El tratamiento se ha planificado de acuerdo a las prácticas médicas actuales, pero a veces se producen problemas. Comuníquese con el médico si tiene algún problema o tiene dudas después del procedimiento.  QUÉ ESPERAR DESPUÉS DEL PROCEDIMIENTO   Después del procedimiento, es típico tener las siguientes sensaciones:  · Cólicos. Generalmente se calman en algunos minutos.  · Dolor. Puede durar hasta dos días.  · Aturdimiento. Si esto le ocurre, recuéstese durante algunos minutos.  Podrá tener un sangrado leve o una secreción oscura que debe detenerse en algunos días. Durante este tiempo deberá usar un apósito sanitario.  INSTRUCCIONES PARA EL CUIDADO EN EL HOGAR  · Evite las relaciones sexuales, las duchas vaginales y el uso de tampones durante 3 días, o según lo que le indique su médico.  · Tome sólo medicamentos de venta libre o recetados, según las indicaciones del médico. No tome aspirina, ya que puede causar hemorragias.  · Si utiliza píldoras anticonceptivas, continúe tomándolas.  · No todos los resultados estarán disponibles durante su visita. En este caso, tenga otra entrevista con su médico para conocerlos. No suponga que es normal si no tiene noticias de su médico o del establecimiento de salud. Es importante el seguimiento de todos los resultados de los estudios.  · Siga los consejos de su médico con respecto a los medicamentos, actividades, visitas y [place] de control.  SOLICITE ATENCIÓN MÉDICA SI:  · Aparece una erupción cutánea.  · Tiene problemas con los medicamentos.  SOLICITE ATENCIÓN MÉDICA DE INMEDIATO SI:  · Tiene una hemorragia abundante o elimina coágulos.  · Tiene fiebre.  · Tiene flujo vaginal  anormal.  · Tiene cólicos que no se alivian luego de tomar analgésicos.  · Se siente mareada, tiene vahídos o se desmaya.  · Siente dolor en el estómago.  Esta información no tiene como fin reemplazar el consejo del médico. Asegúrese de hacerle al médico cualquier pregunta que tenga.  Document Released: 01/26/2013  Elsevier Interactive Patient Education © 2017 Elsevier Inc.

## 2016-12-10 ENCOUNTER — Telehealth: Payer: Self-pay | Admitting: *Deleted

## 2016-12-10 NOTE — Telephone Encounter (Signed)
Called Conni with [employer] [phone] and left a message we are calling with some information- please call our office.

## 2016-12-10 NOTE — Telephone Encounter (Signed)
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

## 2016-12-15 ENCOUNTER — Encounter: Payer: Self-pay | Admitting: *Deleted

## 2016-12-15 NOTE — Telephone Encounter (Signed)
Called Derra with [employer] [phone] and left a message we are calling with some information, please call our office. Will send letter.

## 2016-12-16 ENCOUNTER — Other Ambulatory Visit (INDEPENDENT_AMBULATORY_CARE_PROVIDER_SITE_OTHER): Payer: Self-pay | Admitting: Physician Assistant

## 2016-12-16 DIAGNOSIS — I10 Essential (primary) hypertension: Secondary | ICD-10-CM

## 2016-12-16 NOTE — Telephone Encounter (Signed)
FWD to PCP. Tempestt S Roberts, CMA  

## 2016-12-17 MED FILL — ?METFORMIN HCL 500MG TABLET: 500 | 30 days supply | Qty: 30 | Fill #2

## 2016-12-17 MED FILL — LOSARTAN-HCTZ 100-12.5 MG T: 100-12.5 | 30 days supply | Qty: 30 | Fill #0

## 2017-01-07 ENCOUNTER — Telehealth: Payer: Self-pay | Admitting: Internal Medicine

## 2017-01-07 DIAGNOSIS — R7303 Prediabetes: Secondary | ICD-10-CM

## 2017-01-07 MED FILL — ?METFORMIN HCL 500MG TABLET: 500 | 30 days supply | Qty: 30 | Fill #3

## 2017-01-07 NOTE — Telephone Encounter (Signed)
Pt. Called stating that she is taking metformin and that her DM has been high. Pt. States that instead of taking 1 pill she has been taking 2 and that she is not able to get a refill b/c she is early. Pt. States she only has two pills left. Please f/u

## 2017-01-07 NOTE — Telephone Encounter (Signed)
Will forward to pcp

## 2017-01-08 MED ORDER — METFORMIN HCL 500 MG PO TABS: 1000.0000 mg | ORAL_TABLET | Freq: Every day | ORAL | 3 refills | Status: DC

## 2017-01-08 NOTE — Telephone Encounter (Signed)
Cheryl Manning would you be able to contact patient

## 2017-01-08 NOTE — Telephone Encounter (Signed)
Called patient LVM to call back

## 2017-01-15 ENCOUNTER — Ambulatory Visit: Payer: BLUE CROSS/BLUE SHIELD | Admitting: Internal Medicine

## 2017-01-19 MED FILL — LOSARTAN-HCTZ 100-12.5 MG T: 100-12.5 | 30 days supply | Qty: 30 | Fill #1

## 2017-01-27 DIAGNOSIS — R7303 Prediabetes: Secondary | ICD-10-CM

## 2017-01-27 HISTORY — DX: Prediabetes: R73.03

## 2017-01-28 ENCOUNTER — Other Ambulatory Visit: Payer: Self-pay

## 2017-01-28 ENCOUNTER — Other Ambulatory Visit: Payer: Self-pay | Admitting: Pharmacist

## 2017-01-28 ENCOUNTER — Encounter: Payer: Self-pay | Admitting: Internal Medicine

## 2017-01-28 ENCOUNTER — Ambulatory Visit: Payer: BLUE CROSS/BLUE SHIELD | Attending: Internal Medicine | Admitting: Physician Assistant

## 2017-01-28 VITALS — BP 129/84 | HR 84 | Temp 98.3°F | Resp 16 | Ht 66.0 in | Wt 274.0 lb

## 2017-01-28 DIAGNOSIS — R079 Chest pain, unspecified: Secondary | ICD-10-CM

## 2017-01-28 DIAGNOSIS — M545 Low back pain, unspecified: Secondary | ICD-10-CM

## 2017-01-28 DIAGNOSIS — Z79899 Other long term (current) drug therapy: Secondary | ICD-10-CM | POA: Insufficient documentation

## 2017-01-28 DIAGNOSIS — G8929 Other chronic pain: Secondary | ICD-10-CM

## 2017-01-28 DIAGNOSIS — Z789 Other specified health status: Secondary | ICD-10-CM | POA: Diagnosis not present

## 2017-01-28 DIAGNOSIS — Z1239 Encounter for other screening for malignant neoplasm of breast: Secondary | ICD-10-CM

## 2017-01-28 DIAGNOSIS — Z603 Acculturation difficulty: Secondary | ICD-10-CM

## 2017-01-28 DIAGNOSIS — Z91018 Allergy to other foods: Secondary | ICD-10-CM | POA: Insufficient documentation

## 2017-01-28 DIAGNOSIS — Z8249 Family history of ischemic heart disease and other diseases of the circulatory system: Secondary | ICD-10-CM | POA: Diagnosis not present

## 2017-01-28 DIAGNOSIS — Z23 Encounter for immunization: Secondary | ICD-10-CM | POA: Diagnosis not present

## 2017-01-28 DIAGNOSIS — Z7984 Long term (current) use of oral hypoglycemic drugs: Secondary | ICD-10-CM | POA: Diagnosis not present

## 2017-01-28 DIAGNOSIS — Z1231 Encounter for screening mammogram for malignant neoplasm of breast: Secondary | ICD-10-CM | POA: Insufficient documentation

## 2017-01-28 DIAGNOSIS — E119 Type 2 diabetes mellitus without complications: Secondary | ICD-10-CM

## 2017-01-28 LAB — GLUCOSE, POCT (MANUAL RESULT ENTRY): POC Glucose: 114 mg/dl — AB (ref 70–99)

## 2017-01-28 MED ORDER — NAPROXEN 500 MG PO TBEC: 500.0000 mg | DELAYED_RELEASE_TABLET | Freq: Two times a day (BID) | ORAL | 1 refills | Status: DC

## 2017-01-28 MED ORDER — CYCLOBENZAPRINE HCL 10 MG PO TABS: ORAL_TABLET | ORAL | 0 refills | Status: DC

## 2017-01-28 MED ORDER — NAPROXEN 500 MG PO TABS: 500.0000 mg | ORAL_TABLET | Freq: Two times a day (BID) | ORAL | 1 refills | Status: DC

## 2017-01-28 MED FILL — ?CYCLOBENZAPRINE 10 MG TABL: 10 | 20 days supply | Qty: 30 | Fill #0

## 2017-01-28 MED FILL — NAPROXEN 500 MG TABLET: 500 | 30 days supply | Qty: 60 | Fill #0

## 2017-01-28 NOTE — Progress Notes (Signed)
Patient ID: Cheryl Manning, female   DOB: [birth year], 36 y.o.   MRN: [id number]   Cheryl Manning, is a 36 y.o. female  WUJ:[id number]  [id number]  DOB - 1980-11-21  Subjective:  Chief Complaint and HPI: Cheryl Manning is a 36 y.o. female here today for multiple issues  Chest pain X 3 weeks, feels like pressure, worse with changing positions, pain comes and goes, pain is sharp and moderate to severe.  No h/o heart problems.  Also, c/o palpitations.  Lasts several seconds to several minutes.  Dad with multiple MI starting about age 23.  Pain is mid chest.  No SOB.  LBP X 62month. Actually has had this pain long-standing for many years, but only bothering her again for the  Last month or so.   NKI, but does say she has "hernias in 3 vertebrae".  Some general paresthesias(no specific distribution).  No weakness.  H/o xrays and MRI in [place] many years ago-received injections in her back and "relaxers."  No weakness  Wants to get Mammogram  "[name]" with [employer] interpreters  ED/Hospital notes reviewed.   Social History:  SSP Family history:  Early heart disease in her dad, 1st MI age 22-47  ROS:   Constitutional:  No f/c, No night sweats, No unexplained weight loss. EENT:  No vision changes, No blurry vision, No hearing changes. No mouth, throat, or ear problems.  Respiratory: No cough, No SOB Cardiac: +CP, + palpitations GI:  No abd pain, No N/V/D. GU: No Urinary s/sx Musculoskeletal: +Low back pain Neuro: No headache, no dizziness, no motor weakness.  Skin: No rash Endocrine:  No polydipsia. No polyuria.  Psych: Denies SI/HI  No problems updated.  ALLERGIES: Allergies  Allergen Reactions  . Cinnamon Anaphylaxis    PAST MEDICAL HISTORY: No past medical history on file.  MEDICATIONS AT HOME: Prior to Admission medications   Medication Sig Start Date End Date Taking? Authorizing Provider  cetirizine (ZYRTEC) 10 MG tablet Take 1 tablet (10 mg total) by  mouth daily. 10/07/16   Marcine Matar, MD  cyclobenzaprine (FLEXERIL) 10 MG tablet 1/2 tab tid prn muscle spasm 01/28/17   Georgian Co M, PA-C  diclofenac sodium (VOLTAREN) 1 % GEL Apply 2 g topically 4 (four) times daily. Patient not taking: Reported on 12/04/2016 10/07/16   Marcine Matar, MD  fluticasone [facility]) 50 MCG/ACT nasal spray Place 2 sprays into both nostrils daily. 10/07/16   Marcine Matar, MD  losartan-hydrochlorothiazide (HYZAAR) 100-12.5 MG tablet TAKE 1 TABLET BY MOUTH DAILY. 12/16/16   Loletta Specter, PA-C  medroxyPROGESTERone (PROVERA) 10 MG tablet Take one tab daily for 7 days the third week of every month to induce menstrual periods 10/07/16   Marcine Matar, MD  metFORMIN (GLUCOPHAGE) 500 MG tablet Take 2 tablets (1,000 mg total) by mouth daily with breakfast. Dose increase 01/08/17   Marcine Matar, MD  naproxen (EC NAPROSYN) 500 MG EC tablet Take 1 tablet (500 mg total) by mouth 2 (two) times daily with a meal. Prn pain 01/28/17   Anders Simmonds, PA-C  pseudoephedrine (SUDAFED) 30 MG tablet Take 1 tablet (30 mg total) by mouth every 8 (eight) hours as needed for congestion. Patient not taking: Reported on 12/04/2016 10/07/16   Marcine Matar, MD     Objective:  EXAM:   Vitals:   01/28/17 1036  BP: 129/84  Pulse: 84  Resp: 16  Temp: 98.3 F (36.8 C)  SpO2: 98%  Weight: 274 lb (124.3  kg)  Height:  (1.676 m)    General appearance : A&OX3. NAD. Non-toxic-appearing HEENT: Atraumatic and Normocephalic.  PERRLA. EOM intact.  TM clear B. Mouth-MMM, post pharynx WNL w/o erythema, No PND. Neck: supple, no JVD. No cervical lymphadenopathy. No thyromegaly Chest/Lungs:  Breathing-non-labored, Good air entry bilaterally, breath sounds normal without rales, rhonchi, or wheezing.  No TTP other than mild TTP over Xyphoid process CVS: S1 S2 regular, no murmurs, gallops, rubs  LB:  +TTP paraspinus muscles, nos L-spine TTP; neg SLR  B Extremities: Bilateral Lower Ext shows no edema, both legs are warm to touch with = pulse throughout.  DTR=B throughout.   Neurology:  CN II-XII grossly intact, Non focal.   Psych:  TP linear. J/I WNL. Normal speech. Appropriate eye contact and affect.  Skin:  No Rash  Data Review Lab Results  Component Value Date   HGBA1C 6.1 10/07/2016     Assessment & Plan   1. Chest pain, unspecified type Unlikely cardiac but she does have + risk factors.   2. Type 2 diabetes mellitus without complication, without long-term current use of insulin (HCC) Work on diabetic diet and exercise.  Continue metformin - POCT glucose (manual entry)  3. Language barrier [employer] interpreters  4. Chronic bilateral low back pain without sciatica With h/o "disc hernias" and back injections.  No red flags today - DG Lumbar Spine 2-3 Views; Future - naproxen (EC NAPROSYN) 500 MG EC tablet; Take 1 tablet (500 mg total) by mouth 2 (two) times daily with a meal. Prn pain  Dispense: 60 tablet; Refill: 1 - cyclobenzaprine (FLEXERIL) 10 MG tablet; 1/2 tab tid prn muscle spasm  Dispense: 30 tablet; Refill: 0  5. Breast cancer screening - MM Digital Screening; Future   Patient have been counseled extensively about nutrition and exercise  F/up Dr Laural Benes in 1 month  The patient was given clear instructions to go to ER or return to medical center if symptoms don't improve, worsen or new problems develop. The patient verbalized understanding. The patient was told to call to get lab results if they haven't heard anything in the next week.     Georgian Co, PA-C [facility] [place], [place] [phone]   01/28/2017, 11:27 AM

## 2017-01-28 NOTE — Progress Notes (Signed)
Cheryl Manning [id number]  Back and chest pain Back pain- 6, chronic Chest- 9, acute

## 2017-01-28 NOTE — Patient Instructions (Signed)
Dolor de pecho inespecfico (Nonspecific Chest Pain) El dolor de pecho puede deberse a muchas enfermedades diferentes. Siempre existe una posibilidad de que el dolor est relacionado con algo grave, como un infarto de miocardio o un cogulo sanguneo en los pulmones. Hay muchas enfermedades que no son potencialmente mortales que pueden causar dolor de [place]. Si tiene Social research officer, government de Engineer, building services, es muy importante que se controle con el mdico. CAUSAS Las causas del dolor de pecho pueden ser las siguientes:  Acidez estomacal.  Neumona o bronquitis.  Ansiedad o estrs.  Inflamacin de la zona que rodea al corazn (pericarditis) o a los pulmones (pleuritis o pleuresa).  Un cogulo sanguneo en el pulmn.  Colapso de un pulmn (neumotrax), que puede aparecer de [employer] repentina por s solo (neumotrax espontneo) o debido a un traumatismo en el trax.  Culebrilla (virus de la varicela zster).  Infarto de miocardio.  Dao de los [place], los msculos y los cartlagos que conforman la pared torcica. Esto puede incluir lo siguiente: ? Hematomas seos debido a lesiones. ? Distensiones musculares o de los cartlagos por tos frecuente o repetida, o por exceso de trabajo. ? Fractura de una o ms costillas. ? Dolor de Database administrator debido a inflamacin (costocondritis). FACTORES DE RIESGO Los factores de riesgo de tener dolor de pecho pueden incluir lo siguiente:  Actividades que incrementan el riesgo de sufrir traumatismos o lesiones en el trax.  Infecciones o enfermedades respiratorias que causan tos frecuente.  Enfermedades o [employer] comidas que pueden causar Geographical information systems officer.  Enfermedades cardacas o antecedentes familiares de enfermedades cardacas.  Enfermedades o comportamientos de salud que aumentan el riesgo de tener un cogulo sanguneo.  Haber tenido varicela (varicela zster). SIGNOS Y SNTOMAS El dolor de pecho puede provocar las siguientes sensaciones:  Ardor u hormigueo en la  superficie o en lo profundo del pecho.  Dolor opresivo, continuo o constrictivo.  Dolor vago o intenso que empeora al [employer], toser o inhalar profundamente.  Dolor que tambin se siente en la espalda, el cuello, el hombro o el brazo, o dolor que se irradia a cualquiera de estas zonas. El dolor de pecho puede aparecer y Armed forces operational officer, o bien puede ser constante. DIAGNSTICO Ileene Hutchinson se necesiten anlisis de laboratorio u otros estudios para Animator causa del Social research officer, government. El mdico puede indicarle que se haga una prueba llamada EGC (electrocadiograma) ambulatorio. El Radio broadcast assistant los patrones de los latidos cardacos en el momento en que se realiza el [place]. Tambin pueden hacerle otros estudios, por ejemplo:  Ecocardiograma transtorcico (ETT). Durante el ecocardiograma, se usan ondas sonoras para crear una imagen de todas las estructuras cardacas y evaluar cmo circula la sangre por el corazn.  Ecocardiograma transesofgico (ETE).Este es un estudio de diagnstico por imgenes ms avanzado que el obtiene imgenes del interior del cuerpo. Le permite al mdico ver el corazn con mayor detalle.  Monitoreo cardaco. Permite que el mdico controle la frecuencia y el ritmo cardaco en tiempo real.  Monitor Holter. Es un dispositivo porttil que Alber[employer]on's latidos del corazn y puede ayudar a Retail buyer las arritmias cardacas. Le permite al [employer] registrar la actividad [place], si es necesario.  Pruebas de esfuerzo. Estas pueden realizarse durante el ejercicio o mediante la administracin de un medicamento que acelera los latidos del corazn.  Anlisis de [place].  Diagnstico por imgenes. TRATAMIENTO El tratamiento depende de la causa del dolor de [place]. El tratamiento puede incluir lo siguiente:  Medicamentos. Estos pueden incluir lo siguiente: ? Inhibidores de Best boy  estomacal. ? Antiinflamatorios. ? Analgsicos para las enfermedades  inflamatorias. ? Antibiticos, si hay una infeccin. ? Medicamentos para [employer]. ? Medicamentos para tratar la enfermedad arterial coronaria.  Tratamiento complementario para las enfermedades que no requieren la toma de medicamentos. Esto puede incluir lo siguiente: ? Descansar. ? Aplicar compresas fras o calientes en las zonas lesionadas. ? Limitar las actividades hasta que [employer]. INSTRUCCIONES PARA EL CUIDADO EN EL HOGAR  Si le recetaron antibiticos, asegrese de terminarlos, incluso si comienza a sentirse mejor.  Evite las SUPERVALU INC causen dolor de [place].  No consuma ningn producto que contenga tabaco, lo que incluye cigarrillos, tabaco de Theatre manager o Administrator, Civil Service. Si necesita ayuda para dejar de fumar, consulte al mdico.  No beba alcohol.  Tome los medicamentos solamente como se lo haya indicado el mdico.  Concurra a todas las visitas de control como se lo haya indicado el mdico. Esto es importante. Esto incluye otros estudios si el dolor de pecho no desaparece.  Si la acidez es la causa del dolor de [place], tal vez le aconsejen que mantenga la cabeza levantada (elevada) mientras duerme. Esto reduce la probabilidad de que el cido retroceda del estmago al esfago.  Haga cambios en su estilo de vida como se lo haya indicado el mdico. Estos pueden incluir lo siguiente: ? Practicar actividad fsica con regularidad. Pida al mdico que le sugiera algunas actividades que sean seguras para usted. ? Consumir una dieta cardiosaludable. Un nutricionista matriculado puede ayudarlo a Software engineer saludables. ? Mantener un peso saludable. ? Controlar la diabetes, si es necesario. ? Reducir las situaciones de estrs.  SOLICITE ATENCIN MDICA SI:  El dolor de pecho no desaparece despus del tratamiento.  Tiene una erupcin cutnea con ampollas en el pecho.  Tiene fiebre.  SOLICITE ATENCIN MDICA DE INMEDIATO SI:  El  dolor en el pecho es ms intenso.  La tos empeora, o expectora sangre.  Siente un dolor abdominal intenso.  Siente debilidad intensa.  Se desmaya.  Tiene escalofros.  Tiene una molestia repentina e inexplicable en el pecho.  Tiene molestias repentinas e Exxon Mobil Corporation, la espalda, el cuello o la [place].  Le falta el aire en cualquier momento.  Comienza a sudar de [place] repentina o la piel se le humedece.  Siente nuseas o vomita.  Se siente repentinamente mareado o se desmaya.  Siente que el corazn comienza a latir rpidamente o que se saltea latidos. Estos sntomas pueden representar un problema grave que constituye Radio broadcast assistant. No espere hasta que los sntomas desaparezcan. Solicite atencin mdica de inmediato. Comunquese con el servicio de emergencias de su localidad (911 en los [place]). No conduzca por sus propios medios [employer]Max Incorporated. Esta informacin no tiene Theme park manager el consejo del mdico. Asegrese de hacerle al mdico cualquier pregunta que tenga. Document Released: 04/07/2005 Document Revised: 04/28/2014 Document Reviewed: 10/15/2015 Elsevier Interactive Patient Education  2017 Elsevier Inc. La diabetes mellitus y los alimentos (Diabetes Mellitus and Food) Es importante que controle su nivel de azcar en la sangre (glucosa). El nivel de glucosa en sangre depende en gran medida de lo que usted come. Comer alimentos saludables en las cantidades [place] a lo largo del Futures trader, aproximadamente a la misma hora [employer], lo ayudar a Chief Operating Officer su nivel de Event organiser. Tambin puede ayudarlo a retrasar o Fish farm manager de la diabetes mellitus. Comer de manera saludable incluso puede ayudarlo a mejorar el nivel de presin arterial  y a Barista o Pharmacologist un peso saludable. Entre las recomendaciones generales para alimentarse y Water quality scientist los alimentos de forma saludable, se incluyen las siguientes:  Respetar las  comidas principales y comer colaciones con regularidad. Evitar pasar largos perodos sin comer con el fin de perder peso.  Seguir una dieta que consista principalmente en alimentos de origen vegetal, como frutas, vegetales, frutos secos, legumbres y cereales integrales.  Utilizar mtodos de coccin a baja temperatura, como hornear, en lugar de mtodos de coccin a alta temperatura, como frer en abundante aceite. Trabaje con el nutricionista para aprender a Acupuncturist nutricional de las etiquetas de los alimentos. CMO PUEDEN AFECTARME LOS ALIMENTOS? Carbohidratos Los carbohidratos afectan el nivel de glucosa en sangre ms que cualquier otro tipo de alimento. El nutricionista lo ayudar a Chief Strategy Officer cuntos carbohidratos puede consumir en cada comida y ensearle a contarlos. El recuento de carbohidratos es importante para mantener la glucosa en sangre en un nivel saludable, en especial si utiliza insulina o toma determinados medicamentos para la diabetes mellitus. Alcohol El alcohol puede provocar disminuciones sbitas de la glucosa en sangre (hipoglucemia), en especial si utiliza insulina o toma determinados medicamentos para la diabetes mellitus. La hipoglucemia es una afeccin que puede poner en peligro la vida. Los sntomas de la hipoglucemia (somnolencia, mareos y Administrator) son similares a los sntomas de haber consumido mucho alcohol. Si el mdico lo autoriza a beber alcohol, hgalo con moderacin y siga estas pautas:  Las mujeres no deben beber ms de un trago por da, y los hombres no deben beber ms de dos tragos por Futures trader. Un trago es igual a: ? 12 onzas (355 ml) de cerveza ? 5 onzas de vino (150 ml) de vino ? 1,5onzas (45ml) de bebidas espirituosas  No beba con el estmago vaco.  Mantngase hidratado. Beba agua, gaseosas dietticas o t helado sin azcar.  Las gaseosas comunes, los jugos y otros refrescos podran contener muchos carbohidratos y se Heritage manager. QU  ALIMENTOS NO SE RECOMIENDAN? Cuando haga las elecciones de alimentos, es importante que recuerde que todos los alimentos son distintos. Algunos tienen menos nutrientes que otros por porcin, aunque podran tener la misma cantidad de caloras o carbohidratos. Es difcil darle al cuerpo lo que necesita cuando consume alimentos con menos nutrientes. Estos son algunos ejemplos de alimentos que debera evitar ya que contienen muchas caloras y carbohidratos, pero pocos nutrientes:  Neurosurgeon trans (la mayora de los alimentos procesados incluyen grasas trans en la etiqueta de Informacin nutricional).  Gaseosas comunes.  Jugos.  Caramelos.  Dulces, como tortas, pasteles, rosquillas y [place].  Comidas fritas. QU ALIMENTOS PUEDO COMER? Consuma alimentos ricos en nutrientes, que nutrirn el cuerpo y lo mantendrn saludable. Los alimentos que debe comer tambin dependern de varios factores, como:  Las caloras que necesita.  Los medicamentos que toma.  Su peso.  El nivel de glucosa en [place].  El [place] de presin arterial.  El nivel de colesterol. Debe consumir una amplia variedad de alimentos, por ejemplo:  Protenas. ? Cortes de Target Corporation. ? Protenas con bajo contenido de grasas saturadas, como pescado, clara de huevo y frijoles. Evite las carnes procesadas.  Frutas y vegetales. ? [place] y Sports administrator que pueden ayudar a AGCO Corporation niveles sanguneos de [place], como [place], mangos y batatas.  Productos lcteos. ? Elija productos lcteos sin grasa o con bajo contenido de [place], como [place], yogur y [place].  Cereales, panes, pastas y arroz. ? Elija cereales integrales, como panes multicereales, avena en grano y  arroz integral. Estos alimentos pueden ayudar a controlar la presin arterial.  Grasas. ? Alimentos que contengan grasas saludables, como frutos secos, Chartered certified accountant, aceite de [place], aceite de canola y pescado. TODOS LOS QUE PADECEN DIABETES MELLITUS TIENEN EL MISMO  PLAN DE COMIDAS? Dado que todas las personas que padecen diabetes mellitus son distintas, no hay un solo plan de comidas que funcione para todos. Es muy importante que se rena con un nutricionista que lo ayudar a crear un plan de comidas adecuado para usted. Esta informacin no tiene Theme park manager el consejo del mdico. Asegrese de hacerle al mdico cualquier pregunta que tenga. Document Released: 07/15/2007 Document Revised: 04/28/2014 Document Reviewed: 03/04/2013 Elsevier Interactive Patient Education  2017 [employer].

## 2017-01-29 ENCOUNTER — Ambulatory Visit: Payer: BLUE CROSS/BLUE SHIELD | Admitting: Internal Medicine

## 2017-01-29 MED FILL — ?METFORMIN HCL 500MG TABLET: 500 | 30 days supply | Qty: 60 | Fill #0

## 2017-02-09 ENCOUNTER — Ambulatory Visit (HOSPITAL_COMMUNITY)
Admission: RE | Admit: 2017-02-09 | Discharge: 2017-02-09 | Disposition: A | Discharge status: Home / Self Care | Payer: BLUE CROSS/BLUE SHIELD | Source: Ambulatory Visit | Attending: Physician Assistant | Admitting: Physician Assistant

## 2017-02-09 ENCOUNTER — Telehealth: Payer: Self-pay | Admitting: Internal Medicine

## 2017-02-09 DIAGNOSIS — M545 Low back pain: Secondary | ICD-10-CM | POA: Diagnosis present

## 2017-02-09 DIAGNOSIS — G8929 Other chronic pain: Secondary | ICD-10-CM | POA: Diagnosis not present

## 2017-02-09 DIAGNOSIS — M5136 Other intervertebral disc degeneration, lumbar region: Secondary | ICD-10-CM | POA: Insufficient documentation

## 2017-02-09 DIAGNOSIS — N63 Unspecified lump in unspecified breast: Secondary | ICD-10-CM

## 2017-02-09 NOTE — Telephone Encounter (Signed)
Will forward to pcp

## 2017-02-09 NOTE — Telephone Encounter (Signed)
The breast cancer center called to request an order to be modified to a Diagnostic Mammogram and Ultrasound Bilateral, since she has issued in both side, the order that was original was for a regular mammogram, plerase faxt this order to breast Cancer [phone] [id number]

## 2017-02-11 ENCOUNTER — Other Ambulatory Visit: Payer: Self-pay | Admitting: Physician Assistant

## 2017-02-11 DIAGNOSIS — M545 Low back pain, unspecified: Secondary | ICD-10-CM

## 2017-02-11 DIAGNOSIS — G8929 Other chronic pain: Secondary | ICD-10-CM

## 2017-02-18 MED FILL — LOSARTAN-HCTZ 100-12.5 MG T: 100-12.5 | 30 days supply | Qty: 30 | Fill #2

## 2017-02-18 NOTE — Telephone Encounter (Signed)
Pt called requesting status of order for diagnostic mammogram, pt states they are waiting for an order to be able to reschedule. Pt is upset because we have not got in contact with the breast center to be able to reschedule.  Pt is also requesting xray results. Please f/up

## 2017-02-19 NOTE — Telephone Encounter (Signed)
Spoke witth Tanya with [facility] and pt is schedule for mm and us tomorrow Nov 2

## 2017-02-20 ENCOUNTER — Ambulatory Visit
Admission: RE | Admit: 2017-02-20 | Discharge: 2017-02-20 | Disposition: A | Discharge status: Home / Self Care | Payer: BLUE CROSS/BLUE SHIELD | Source: Ambulatory Visit | Attending: Internal Medicine | Admitting: Internal Medicine

## 2017-02-20 DIAGNOSIS — N63 Unspecified lump in unspecified breast: Secondary | ICD-10-CM

## 2017-03-02 MED FILL — NAPROXEN 500 MG TABLET: 500 | 30 days supply | Qty: 60 | Fill #1

## 2017-03-02 MED FILL — FLUTICASONE PROP 50 MCG SPR: 50 | 30 days supply | Qty: 16 | Fill #2

## 2017-03-04 ENCOUNTER — Telehealth: Payer: Self-pay | Admitting: *Deleted

## 2017-03-04 NOTE — Telephone Encounter (Signed)
-----   Message from Margaretmary LombardNubia K [place], [place] sent at 03/04/2017  2:48 PM EST ----- Please inform patient of lab results

## 2017-03-04 NOTE — Telephone Encounter (Signed)
Cheryl Manning, Angela M, PA-C  [place], [place], [place]        Please call pateint. 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 I have referred her to Physical therapy which should help.  Thanks,  Georgian CoAngela McClung, PA-C    With assistance of interpreter Hessie Dienerlan, [phone]. Left message on voicemail.

## 2017-03-04 NOTE — Telephone Encounter (Signed)
CMA call regarding x ray results   Patient did not answer but left a detailed message per DPR & to call back if any questions

## 2017-03-25 MED FILL — LOSARTAN-HCTZ 100-12.5 MG T: 100-12.5 | 30 days supply | Qty: 30 | Fill #3

## 2017-03-27 ENCOUNTER — Encounter: Payer: Self-pay | Admitting: Family Medicine

## 2017-03-27 ENCOUNTER — Ambulatory Visit: Payer: BLUE CROSS/BLUE SHIELD | Attending: Family Medicine | Admitting: Family Medicine

## 2017-03-27 VITALS — BP 143/91 | HR 78 | Temp 97.8°F | Ht 66.0 in | Wt 281.2 lb

## 2017-03-27 DIAGNOSIS — Z79899 Other long term (current) drug therapy: Secondary | ICD-10-CM | POA: Insufficient documentation

## 2017-03-27 DIAGNOSIS — I10 Essential (primary) hypertension: Secondary | ICD-10-CM | POA: Insufficient documentation

## 2017-03-27 DIAGNOSIS — Z7951 Long term (current) use of inhaled steroids: Secondary | ICD-10-CM | POA: Diagnosis not present

## 2017-03-27 DIAGNOSIS — G5601 Carpal tunnel syndrome, right upper limb: Secondary | ICD-10-CM | POA: Diagnosis not present

## 2017-03-27 DIAGNOSIS — Z7984 Long term (current) use of oral hypoglycemic drugs: Secondary | ICD-10-CM | POA: Diagnosis not present

## 2017-03-27 DIAGNOSIS — R7303 Prediabetes: Secondary | ICD-10-CM | POA: Diagnosis not present

## 2017-03-27 DIAGNOSIS — Z889 Allergy status to unspecified drugs, medicaments and biological substances status: Secondary | ICD-10-CM | POA: Diagnosis not present

## 2017-03-27 DIAGNOSIS — Z791 Long term (current) use of non-steroidal anti-inflammatories (NSAID): Secondary | ICD-10-CM | POA: Insufficient documentation

## 2017-03-27 DIAGNOSIS — M5136 Other intervertebral disc degeneration, lumbar region: Secondary | ICD-10-CM | POA: Insufficient documentation

## 2017-03-27 DIAGNOSIS — Z9109 Other allergy status, other than to drugs and biological substances: Secondary | ICD-10-CM | POA: Insufficient documentation

## 2017-03-27 LAB — GLUCOSE, POCT (MANUAL RESULT ENTRY): POC Glucose: 96 mg/dl (ref 70–99)

## 2017-03-27 MED ORDER — NAPROXEN 500 MG PO TABS: 500.0000 mg | ORAL_TABLET | Freq: Two times a day (BID) | ORAL | 1 refills | Status: DC

## 2017-03-27 MED ORDER — GABAPENTIN 300 MG PO CAPS: 300.0000 mg | ORAL_CAPSULE | Freq: Every day | ORAL | 1 refills | Status: DC

## 2017-03-27 MED ORDER — EPINEPHRINE 0.3 MG/0.3ML IJ SOAJ: 0.3000 mg | Freq: Once | INTRAMUSCULAR | 1 refills | Status: AC

## 2017-03-27 MED ORDER — METHOCARBAMOL 500 MG PO TABS: 500.0000 mg | ORAL_TABLET | Freq: Three times a day (TID) | ORAL | 1 refills | Status: DC | PRN

## 2017-03-27 MED FILL — GABAPENTIN 300 MG CAPSULE: 300 | 30 days supply | Qty: 30 | Fill #0

## 2017-03-27 MED FILL — METHOCARBAMOL 500 MG TABS: 500 | 20 days supply | Qty: 60 | Fill #0

## 2017-03-27 MED FILL — NAPROXEN 500 MG TABLET: 500 | 30 days supply | Qty: 60 | Fill #0

## 2017-03-27 MED FILL — ?METFORMIN HCL 500MG TABLET: 500 | 30 days supply | Qty: 60 | Fill #1

## 2017-03-27 NOTE — Progress Notes (Signed)
Subjective:  Patient ID: Cheryl Manning, female    DOB: [birth year]  Age: 36 y.o. MRN: [id number]  CC: Hypertension and Establish Care   HPI Cheryl Manning is a 36 year old female with a history of prediabetes (A1c 6.1), hypertension who presents today with complaints of low back pain.  She was seen by the PA for the aforementioned complaint, 1 month ago and x-ray of the lumbar spine revealed degenerative disc disease changes at L4-L5 for which she was referred to physical therapy and is awaiting an appointment. She has been compliant with her medications but complains cyclobenzaprine has been ineffective.  She complains of tingling on the medial aspect of her right palm and this radiates to her forearm.  She does have history of carpal tunnel syndrome and uses bilateral wrist braces with no improvement in symptoms. She works in housekeeping.  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   Past Medical History:  Diagnosis Date  . HTN (hypertension) 06/24/2016  . Prediabetes 01/27/2017    History reviewed. No pertinent surgical history.   Allergies  Allergen Reactions  . Cinnamon Anaphylaxis     Outpatient Medications Prior to Visit  Medication Sig Dispense Refill  . cetirizine (ZYRTEC) 10 MG tablet Take 1 tablet (10 mg total) by mouth daily. 30 tablet 6  . fluticasone (FLONASE) 50 MCG/ACT nasal spray Place 2 sprays into both nostrils daily. 16 g 6  . losartan-hydrochlorothiazide (HYZAAR) 100-12.5 MG tablet TAKE 1 TABLET BY MOUTH DAILY. 90 tablet 1  . medroxyPROGESTERone (PROVERA) 10 MG tablet Take one tab daily for 7 days the third week of every month to induce menstrual periods 7 tablet 6  . metFORMIN (GLUCOPHAGE) 500 MG tablet Take 2 tablets (1,000 mg total) by mouth daily with breakfast. Dose increase 90 tablet 3  . pseudoephedrine (SUDAFED) 30 MG tablet Take 1 tablet (30 mg total) by mouth  every 8 (eight) hours as needed for congestion. 12 tablet 0  . cyclobenzaprine (FLEXERIL) 10 MG tablet 1/2 tab tid prn muscle spasm 30 tablet 0  . naproxen (NAPROSYN) 500 MG tablet Take 1 tablet (500 mg total) by mouth 2 (two) times daily with a meal. 60 tablet 1  . diclofenac sodium (VOLTAREN) 1 % GEL Apply 2 g topically 4 (four) times daily. (Patient not taking: Reported on 12/04/2016) 100 g 2   No facility-administered medications prior to visit.     ROS Review of Systems  Constitutional: Negative for activity change, appetite change and fatigue.  HENT: Negative for congestion, sinus pressure and sore throat.   Eyes: Negative for visual disturbance.  Respiratory: Negative for cough, chest tightness, shortness of breath and wheezing.   Cardiovascular: Negative for chest pain and palpitations.  Gastrointestinal: Negative for abdominal distention, abdominal pain and constipation.  Endocrine: Negative for polydipsia.  Genitourinary: Negative for dysuria and frequency.  Musculoskeletal:       See hpi  Skin: Negative for rash.  Neurological: Negative for tremors, light-headedness and numbness.  Hematological: Does not bruise/bleed easily.  Psychiatric/Behavioral: Negative for agitation and behavioral problems.    Objective:  BP (!) 143/91   Pulse 78   Temp 97.8 F (36.6 C) (Oral)   Ht 5\' 6"  (1.676 m)   Wt 281 lb 3.2 oz (127.6 kg)   SpO2 95%   BMI 45.39 kg/m   BP/Weight 03/27/2017 01/28/2017 12/04/2016  Systolic BP 143 129 146  Diastolic BP 91 84 87  Wt. (Lbs) 281.2 274 271.8  BMI 45.39 44.22 44.54      Physical Exam  Constitutional: She is oriented to person, place, and time. She appears well-developed and well-nourished.  Cardiovascular: Normal rate, normal heart sounds and intact distal pulses.  No murmur heard. Pulmonary/Chest: Effort normal and breath sounds normal. She has no wheezes. She has no rales. She exhibits no tenderness.  Abdominal: Soft. Bowel sounds are  normal. She exhibits no distension and no mass. There is no tenderness.  Musculoskeletal: Normal range of motion.  Positive Tinel and Phalen's sign in the right hand  Neurological: She is alert and oriented to person, place, and time.  Skin: Skin is warm and dry.  Psychiatric: She has a normal mood and affect.    Lab Results  Component Value Date   HGBA1C 6.1 10/07/2016    Assessment & Plan:   1.  Prediabetes Last A1c was 6.1 Continue metformin - POCT glucose (manual entry)  2. Degenerative disc disease, lumbar Uncontrolled Awaiting PT appointment Discontinue Flexeril due to ineffectiveness and commence Robaxin Refill naproxen - naproxen (NAPROSYN) 500 MG tablet; Take 1 tablet (500 mg total) by mouth 2 (two) times daily with a meal.  Dispense: 60 tablet; Refill: 1 - methocarbamol (ROBAXIN) 500 MG tablet; Take 1 tablet (500 mg total) by mouth every 8 (eight) hours as needed for muscle spasms.  Dispense: 60 tablet; Refill: 1  3. Carpal tunnel syndrome of right wrist Uncontrolled Continue wrist brace We will place on gabapentin She may benefit from cortisone injections versus release surgery due to failure of conservative management Were placed on gabapentin - AMB referral to orthopedics - gabapentin (NEURONTIN) 300 MG capsule; Take 1 capsule (300 mg total) by mouth at bedtime.  Dispense: 30 capsule; Refill: 1  4. History of allergic reaction - EPINEPHrine (EPIPEN 2-PAK) 0.3 mg/0.3 mL IJ SOAJ injection; Inject 0.3 mLs (0.3 mg total) into the muscle once for 1 dose.  Dispense: 1 Device; Refill: 1   Meds ordered this encounter  Medications  . naproxen (NAPROSYN) 500 MG tablet    Sig: Take 1 tablet (500 mg total) by mouth 2 (two) times daily with a meal.    Dispense:  60 tablet    Refill:  1  . methocarbamol (ROBAXIN) 500 MG tablet    Sig: Take 1 tablet (500 mg total) by mouth every 8 (eight) hours as needed for muscle spasms.    Dispense:  60 tablet    Refill:  1  .  EPINEPHrine (EPIPEN 2-PAK) 0.3 mg/0.3 mL IJ SOAJ injection    Sig: Inject 0.3 mLs (0.3 mg total) into the muscle once for 1 dose.    Dispense:  1 Device    Refill:  1  . gabapentin (NEURONTIN) 300 MG capsule    Sig: Take 1 capsule (300 mg total) by mouth at bedtime.    Dispense:  30 capsule    Refill:  1    Follow-up: Return in about 6 weeks (around 05/08/2017) for follow up of chronic medical conditions with Dr Laural BenesJohnson (PCP).   Jaclyn ShaggyEnobong Amao MD

## 2017-04-08 ENCOUNTER — Encounter (HOSPITAL_COMMUNITY): Payer: Self-pay | Admitting: Emergency Medicine

## 2017-04-08 ENCOUNTER — Ambulatory Visit (HOSPITAL_COMMUNITY)
Admission: EM | Admit: 2017-04-08 | Discharge: 2017-04-08 | Disposition: A | Discharge status: Home / Self Care | Payer: BLUE CROSS/BLUE SHIELD | Attending: Family Medicine | Admitting: Family Medicine

## 2017-04-08 ENCOUNTER — Other Ambulatory Visit: Payer: Self-pay

## 2017-04-08 DIAGNOSIS — G510 Bell's palsy: Secondary | ICD-10-CM | POA: Diagnosis not present

## 2017-04-08 DIAGNOSIS — J00 Acute nasopharyngitis [common cold]: Secondary | ICD-10-CM

## 2017-04-08 MED ORDER — CETIRIZINE-PSEUDOEPHEDRINE ER 5-120 MG PO TB12: 1.0000 | ORAL_TABLET | Freq: Every day | ORAL | 0 refills | Status: DC

## 2017-04-08 MED ORDER — POLYETHYL GLYCOL-PROPYL GLYCOL 0.4-0.3 % OP GEL: 1.0000 "application " | Freq: Every evening | OPHTHALMIC | 0 refills | Status: AC | PRN

## 2017-04-08 MED ORDER — PREDNISONE 10 MG (48) PO TBPK: ORAL_TABLET | ORAL | 0 refills | Status: DC

## 2017-04-08 NOTE — ED Triage Notes (Signed)
Triaged pt using [employer], Interpreter # [phone] [place].  Pt reports URI symptoms including nasal congestion, watery eyes, bilateral ear pain, palpitations, tongue numbness and nausea.  She has been congested for the last four days and the rest of the symptoms started today.

## 2017-04-08 NOTE — Discharge Instructions (Signed)
Your symptoms is consistent with bell's palsy, likely due to your recent cold symptoms. Start prednisone as directed. Artificial tear gel at night to help with dry eyes, you may have to tape your eye shut to prevent injury to the eye. Start flonase, zyrtec-D for nasal congestion. You can use over the counter nasal saline rinse such as neti pot for nasal congestion. Keep hydrated, your urine should be clear to pale yellow in color. Tylenol/motrin for fever and pain. Monitor for any worsening of symptoms, chest pain, shortness of breath, wheezing, swelling of the throat, follow up for reevaluation.   If experiencing worsening symptoms, one sided weakness/numbness, unable to talk, confusion/altered mental status, please call 911 immediately.

## 2017-04-08 NOTE — ED Provider Notes (Signed)
[facility]    CSN: [id number] Arrival date & time: 04/08/17  1525     History   Chief Complaint Chief Complaint  Patient presents with  . URI    HPI Cheryl Manning is a 36 y.o. female.   36 year old female comes in for 4 day history of URI symptoms. States she has had nasal congestion, and starting today with watering of the eyes, trouble closing right eye, bilateral ear pain, palpitations, right sided tongue numbness and nausea. otc ear drop without relief. States she experienced palpitations in the past as well, but currently it is continuously, worse with laying down. Denies weakness, dizziness, syncope. Denies aphasia, ataxia. Denies chest pain, shortness of breath, wheezing. States she feels pressure to the chest. States with some right eye blurry vision. Denies pain, eye redness. Former smoker, 1-2 pack/day, for 20 years.  DM well controlled last a1c 6.1, last lipid panel with well control lipids.       Past Medical History:  Diagnosis Date  . HTN (hypertension) 06/24/2016  . Prediabetes 01/27/2017    Patient Active Problem List   Diagnosis Date Noted  . Prediabetes 01/27/2017  . ASCUS with positive high risk HPV cervical 11/10/2016  . Diabetes mellitus screening 10/07/2016  . Seasonal allergic rhinitis due to pollen 10/07/2016  . Lateral epicondylitis of both elbows 10/07/2016  . Bilateral carpal tunnel syndrome 10/07/2016  . Left breast lump 10/07/2016  . Secondary amenorrhea 10/07/2016  . HTN (hypertension) 06/24/2016    History reviewed. No pertinent surgical history.  OB History    No data available       Home Medications    Prior to Admission medications   Medication Sig Start Date End Date Taking? Authorizing Provider  fluticasone (FLONASE) 50 MCG/ACT nasal spray Place 2 sprays into both nostrils daily. 10/07/16  Yes Marcine Matar, MD  gabapentin (NEURONTIN) 300 MG capsule Take 1 capsule (300 mg total) by mouth at bedtime.  03/27/17  Yes Jaclyn Shaggy, MD  losartan-hydrochlorothiazide (HYZAAR) 100-12.5 MG tablet TAKE 1 TABLET BY MOUTH DAILY. 12/16/16  Yes Loletta Specter, PA-C  metFORMIN (GLUCOPHAGE) 500 MG tablet Take 2 tablets (1,000 mg total) by mouth daily with breakfast. Dose increase 01/08/17  Yes Marcine Matar, MD  methocarbamol (ROBAXIN) 500 MG tablet Take 1 tablet (500 mg total) by mouth every 8 (eight) hours as needed for muscle spasms. 03/27/17  Yes Jaclyn Shaggy, MD  pseudoephedrine (SUDAFED) 30 MG tablet Take 1 tablet (30 mg total) by mouth every 8 (eight) hours as needed for congestion. 10/07/16  Yes Marcine Matar, MD  cetirizine (ZYRTEC) 10 MG tablet Take 1 tablet (10 mg total) by mouth daily. 10/07/16   Marcine Matar, MD  cetirizine-pseudoephedrine (ZYRTEC-D) 5-120 MG tablet Take 1 tablet by mouth daily. 04/08/17   Cathie Hoops, Ashtin Rosner V, PA-C  diclofenac sodium (VOLTAREN) 1 % GEL Apply 2 g topically 4 (four) times daily. Patient not taking: Reported on 12/04/2016 10/07/16   Marcine Matar, MD  medroxyPROGESTERone (PROVERA) 10 MG tablet Take one tab daily for 7 days the third week of every month to induce menstrual periods 10/07/16   Marcine Matar, MD  naproxen (NAPROSYN) 500 MG tablet Take 1 tablet (500 mg total) by mouth 2 (two) times daily with a meal. 03/27/17   Jaclyn Shaggy, MD  Polyethyl Glycol-Propyl Glycol (SYSTANE) 0.4-0.3 % GEL ophthalmic gel Place 1 application into both eyes at bedtime as needed. 04/08/17   Belinda Fisher, PA-C  predniSONE (STERAPRED UNI-PAK 48 TAB) 10 MG (48) TBPK tablet As directed 04/08/17   Belinda FisherYu, Billie Trager V, PA-C    Family History Family History  Problem Relation Age of Onset  . Diabetes Father   . Hypertension Father   . Uterine cancer Sister   . Hypertension Brother   . Drug abuse Paternal Uncle   . Uterine cancer Maternal Grandmother   . Uterine cancer Paternal Grandmother   . Breast cancer Paternal Aunt     Social History Social History   Tobacco Use  .  Smoking status: Former Games developermoker  . Smokeless tobacco: Never Used  Substance Use Topics  . Alcohol use: Not on file  . Drug use: Not on file     Allergies   Cinnamon   Review of Systems Review of Systems  Reason unable to perform ROS: See HPI as above.     Physical Exam Triage Vital Signs ED Triage Vitals [04/08/17 1611]  Enc Vitals Group     BP      Pulse      Resp      Temp      Temp src      SpO2      Weight      Height      Head Circumference      Peak Flow      Pain Score 8     Pain Loc      Pain Edu?      Excl. in GC?    No data found.  Updated Vital Signs BP (!) 143/94 (BP Location: Left Arm)   Pulse 87   Temp 98.4 F (36.9 C) (Oral)   SpO2 97%   Visual Acuity Right Eye Distance: (S) 20/50 w/no corrections Left Eye Distance: (S) 20/50 w/no corrections Bilateral Distance: (S) 20/30 w/no corrections  Right Eye Near:   Left Eye Near:    Bilateral Near:     Physical Exam  Constitutional: She is oriented to person, place, and time. She appears well-developed and well-nourished. No distress.  HENT:  Head: Normocephalic and atraumatic.  Right Ear: External ear and ear canal normal. Tympanic membrane is erythematous. Tympanic membrane is not bulging.  Left Ear: Tympanic membrane, external ear and ear canal normal. Tympanic membrane is not erythematous and not bulging.  Nose: Nose normal. Right sinus exhibits no maxillary sinus tenderness and no frontal sinus tenderness. Left sinus exhibits no maxillary sinus tenderness and no frontal sinus tenderness.  Mouth/Throat: Uvula is midline, oropharynx is clear and moist and mucous membranes are normal.  Eyes: Conjunctivae and EOM are normal. Pupils are equal, round, and reactive to light.  Neck: Normal range of motion. Neck supple.  Cardiovascular: Normal rate, regular rhythm and normal heart sounds. Exam reveals no gallop and no friction rub.  No murmur heard. Pulmonary/Chest: Effort normal and breath sounds  normal. She has no decreased breath sounds. She has no wheezes. She has no rhonchi. She has no rales.  Lymphadenopathy:    She has no cervical adenopathy.  Neurological: She is alert and oriented to person, place, and time. She has normal strength. She is not disoriented. She displays no atrophy. She exhibits normal muscle tone. She displays a negative Romberg sign. Coordination and gait normal. GCS eye subscore is 4. GCS verbal subscore is 5. GCS motor subscore is 6.  Right eyelid with strength 4/5 strength. Mild right sided facial drooping. Tongue strength equal bilaterally. Able to swallow without problems.   Skin: Skin is  warm and dry.  Psychiatric: She has a normal mood and affect. Her behavior is normal. Judgment normal.     UC Treatments / Results  Labs (all labs ordered are listed, but only abnormal results are displayed) Labs Reviewed - No data to display  EKG  EKG Interpretation None      Lab Results  Component Value Date   HGBA1C 6.1 10/07/2016   Lab Results  Component Value Date   CHOL 148 06/24/2016   HDL 39 (L) 06/24/2016   LDLCALC 86 06/24/2016   TRIG 114 06/24/2016   CHOLHDL 3.8 06/24/2016     Radiology No results found.  Procedures Procedures (including critical care time)  Medications Ordered in UC Medications - No data to display   Initial Impression / Assessment and Plan / UC Course  I have reviewed the triage vital signs and the nursing notes.  Pertinent labs & imaging results that were available during my care of the patient were reviewed by me and considered in my medical decision making (see chart for details).    36 year old female with history of HTN, DM comes in for 4 day history of URI symptoms, palpitations and 1 day history of right facial drooping/numbness and right eyelid paralysis. Patient without aphasia, ataxia, confusion, strength normal and equal bilaterally. DM well controlled, lipid panel with well controlled LDL. EKG NSR,  85bpm, no PVCs, ST changes. Patient without syncope, weakness, dizziness. Discussed history and exam most consistent with viral URI with bell's palsy. Will start prednisone. Other symptomatic treatment discussed. Strict return precautions given.  Case discussed with Dr Tracie HarrierHagler, who agrees to plan.   Final Clinical Impressions(s) / UC Diagnoses   Final diagnoses:  Bell's palsy  Acute nasopharyngitis    ED Discharge Orders        Ordered    predniSONE (STERAPRED UNI-PAK 48 TAB) 10 MG (48) TBPK tablet     04/08/17 1712    cetirizine-pseudoephedrine (ZYRTEC-D) 5-120 MG tablet  Daily     04/08/17 1712    Polyethyl Glycol-Propyl Glycol (SYSTANE) 0.4-0.3 % GEL ophthalmic gel  At bedtime PRN     04/08/17 1712         Belinda FisherYu, Shambhavi Salley V, PA-C 04/08/17 2014

## 2017-04-22 MED FILL — ?METFORMIN HCL 500MG TABLET: 500 | 30 days supply | Qty: 60 | Fill #2

## 2017-04-22 MED FILL — LOSARTAN-HCTZ 100-12.5 MG T: 100-12.5 | 30 days supply | Qty: 30 | Fill #4

## 2017-05-01 MED FILL — GABAPENTIN 300 MG CAPSULE: 300 | 30 days supply | Qty: 30 | Fill #1

## 2017-05-01 MED FILL — METHOCARBAMOL 500 MG TABS: 500 | 20 days supply | Qty: 60 | Fill #1

## 2017-05-08 ENCOUNTER — Other Ambulatory Visit: Payer: Self-pay

## 2017-05-08 ENCOUNTER — Encounter: Payer: Self-pay | Admitting: Physical Therapy

## 2017-05-08 ENCOUNTER — Ambulatory Visit: Payer: BLUE CROSS/BLUE SHIELD | Attending: Physician Assistant | Admitting: Physical Therapy

## 2017-05-08 DIAGNOSIS — M545 Low back pain: Secondary | ICD-10-CM | POA: Diagnosis not present

## 2017-05-08 DIAGNOSIS — G8929 Other chronic pain: Secondary | ICD-10-CM | POA: Diagnosis present

## 2017-05-08 DIAGNOSIS — M62830 Muscle spasm of back: Secondary | ICD-10-CM | POA: Insufficient documentation

## 2017-05-08 NOTE — Therapy (Signed)
[facility] [address] [place], [place], [address] Phone: [phone]   Fax:  [phone]  Physical Therapy Evaluation  Patient Details  Name: Cheryl Manning MRN: [id number] Date of Birth: Jan 19, 1981 Referring Provider: Anders SimmondsMcClung, Angela M, PA-C   Encounter Date: 05/08/2017  PT End of Session - 05/08/17 1108    Visit Number  1    Number of Visits  13    Date for PT Re-Evaluation  06/19/17    PT Start Time  1101    PT Stop Time  1148    PT Time Calculation (min)  47 min    Activity Tolerance  Patient tolerated treatment well    Behavior During Therapy  [facility] for tasks assessed/performed       Past Medical History:  Diagnosis Date  . HTN (hypertension) 06/24/2016  . Prediabetes 01/27/2017    History reviewed. No pertinent surgical history.  There were no vitals filed for this visit.   Subjective Assessment - 05/08/17 1107    Subjective  pt is a 37 y.o F with CC of low back pain that began for a long time with exacerbation  about 3 months which she is unsure of what caused it. She stays the pain stays in the low back, and denies numbness but reports tingling in the back. Since onstet she reports pain fluctuates.    Limitations  Sitting;Other (comment) bending forward    How long can you sit comfortably?  5 min    How long can you stand comfortably?  45 -60 min    How long can you walk comfortably?  45-60 min    Diagnostic tests  X-ray     Patient Stated Goals  to decrease pain, improve motion and sitting tolerance.     Currently in Pain?  Yes    Pain Score  8  took medication, 10/10    Pain Location  Back    Pain Orientation  Right;Left;Lower    Pain Descriptors / Indicators  Tightness;Spasm    Pain Type  Chronic pain    Pain Onset  More than a month ago    Pain Frequency  Constant    Aggravating Factors   bending, sitting, laying down for too long, breathing deep    Pain Relieving Factors  medication,     Effect  of Pain on Daily Activities  limited sitting tolerance,          [facility] PT Assessment - 05/08/17 1105      Assessment   Medical Diagnosis  Low back pain    Referring Provider  Anders SimmondsMcClung, Angela M, PA-C    Onset Date/Surgical Date  -- many years with recent exacerbation 3 months go    Hand Dominance  Right    Next MD Visit  05/11/2017    Prior Therapy  yes  elbow      Precautions   Precautions  None    Precaution Comments  avoid bending forward       Restrictions   Weight Bearing Restrictions  No      Balance Screen   Has the patient fallen in the past 6 months  No    Has the patient had a decrease in activity level because of a fear of falling?   No    Is the patient reluctant to leave their home because of a fear of falling?   No      Home Public house managernvironment   Living Environment  Private residence  Living Arrangements  Spouse/significant other    Available Help at Discharge  Available PRN/intermittently;Family    Type of Home  House    Home Access  Stairs to enter    Entrance Stairs-Number of Steps  3    Entrance Stairs-Rails  Can reach both    Home Layout  One level      Prior Function   Level of Independence  Independent with basic ADLs    Vocation  Full time employment cleaning    Vocation Requirements  standing/ walking, pushing pulling, lifting      Cognition   Overall Cognitive Status  Within Functional Limits for tasks assessed      Observation/Other Assessments   Focus on Therapeutic Outcomes (FOTO)   53% limited predicted 29% limited      Posture/Postural Control   Posture/Postural Control  Postural limitations    Postural Limitations  Rounded Shoulders;Forward head      ROM / Strength   AROM / PROM / Strength  AROM;Strength      AROM   AROM Assessment Site  Lumbar    Lumbar Flexion  20 PDM    Lumbar Extension  8 PDM    Lumbar - Right Side Bend  10 ERP    Lumbar - Left Side Bend  10 ERp      Strength   Overall Strength  Within functional limits for  tasks performed    Strength Assessment Site  Hip;Knee      Palpation   Spinal mobility  hypomobility T10-L5    Palpation comment  TTP along bil lumbar paraspinals      Special Tests    Special Tests  Lumbar    Lumbar Tests  Prone Knee Bend Test;Straight Leg Raise      Ambulation/Gait   Gait Pattern  Step-through pattern;Trendelenburg;Antalgic             Objective measurements completed on examination: See above findings.      [facility] Adult PT Treatment/Exercise - 05/08/17 1105      Lumbar Exercises: Stretches   Active Hamstring Stretch  2 reps;30 seconds;Left;Right    Lower Trunk Rotation  -- 2 x 10      Lumbar Exercises: Supine   Pelvic Tilt  10 reps;5 seconds             PT Education - 05/08/17 1153    Education provided  Yes    Education Details  evaluation findings, POC, goals, HEP with form/ rationale. anatomy of area involved.     Person(s) Educated  Patient;Other (comment) interpreter    Methods  Explanation;Verbal cues;Handout    Comprehension  Verbalized understanding;Verbal cues required       PT Short Term Goals - 05/08/17 1201      PT SHORT TERM GOAL #1   Title  Pt will be I with inital HEP    Time  3    Period  Weeks    Status  New    Target Date  05/29/17      PT SHORT TERM GOAL #2   Title  pt will demo proper posture with sitting/ standing and transfers as well as appropriate lifting mechanics to prevent and reduce low back pain     Time  3    Period  Weeks    Status  New    Target Date  05/29/17      PT SHORT TERM GOAL #3   Title  pt to reduce back spasm to  reduce pain to </= 5/10 and promote trunk mobility and therapuetic progression     Time  3    Period  Weeks    Status  New    Target Date  05/29/17        PT Long Term Goals - 05/08/17 1203      PT LONG TERM GOAL #1   Title  pt to increase trunk flexion to >/= 60 degrees and extension/ bil side bending by >/= 8 degrees for functional mobility     Time  6    Period   Weeks    Status  New    Target Date  06/19/17      PT LONG TERM GOAL #2   Title  pt to be able to sit/ stand and walk >/= 45 min with </= 2/10 pain for functional endurance required for work related actiivties and ADLs    Time  6    Period  Weeks    Status  New    Target Date  06/19/17      PT LONG TERM GOAL #3   Title  Increase FOTO score to </= 39% limited to demo improvement in function    Time  6    Period  Weeks    Status  New    Target Date  06/19/17      PT LONG TERM GOAL #4   Title  pt to be I with all HEP given and is able to maintain current level of funciton independently    Time  6    Period  Weeks    Status  New    Target Date  06/19/17             Plan - 05/08/17 1157    Clinical Impression Statement  pt presents to OPPT with CC of chronic flucutating low back pain for years with recent exacerbation 3 months ago with no specific on set. limited trunk mobility in all planes due to pain and muscle spasm. TTP along bil lumbar paraspinals and specifically along L5-L1 with hypomobility with PAIVM. She denies any LE referred symtpoms and has functional LE strength. She would benefit from physical therapy to reduce lumbar muscle spasm, promote trunk mobility and return pt to PLOF by addressing the deficits listed.     Clinical Presentation  Stable    Clinical Decision Making  Low    Rehab Potential  Good    PT Frequency  2x / week    PT Duration  6 weeks    PT Treatment/Interventions  ADLs/Self Care Home Management;Cryotherapy;Electrical Stimulation;Iontophoresis 4mg /ml Dexamethasone;Moist Heat;Ultrasound;Neuromuscular re-education;Manual techniques;Therapeutic exercise;Therapeutic activities;Dry needling;Taping    PT Next Visit Plan  review and update HEP PRN, discuss DN, manual for low back, stretching of hip flexors/ hamstrings, core strength, modalities for pain PRN    PT Home Exercise Plan  lower trunk rotation, hamstring stretching, seated low back stretch,  posterior pelvic tilt.    Consulted and Agree with Plan of Care  Patient       Patient will benefit from skilled therapeutic intervention in order to improve the following deficits and impairments:  Pain, Improper body mechanics, Postural dysfunction, Hypomobility, Increased fascial restricitons, Increased muscle spasms, Decreased activity tolerance, Decreased endurance, Decreased range of motion, Obesity  Visit Diagnosis: Chronic bilateral low back pain, with sciatica presence unspecified  Muscle spasm of back     Problem List Patient Active Problem List   Diagnosis Date Noted  . Prediabetes 01/27/2017  .  ASCUS with positive high risk HPV cervical 11/10/2016  . Diabetes mellitus screening 10/07/2016  . Seasonal allergic rhinitis due to pollen 10/07/2016  . Lateral epicondylitis of both elbows 10/07/2016  . Bilateral carpal tunnel syndrome 10/07/2016  . Left breast lump 10/07/2016  . Secondary amenorrhea 10/07/2016  . HTN (hypertension) 06/24/2016    Lulu Riding PT, DPT, LAT, ATC  05/08/17  12:13 PM     [facility] [address] [place], [place], [address] Phone: [phone]   Fax:  [phone]  Name: Cheryl Manning MRN: [id number] Date of Birth: [birth year]

## 2017-05-11 ENCOUNTER — Encounter: Payer: Self-pay | Admitting: Internal Medicine

## 2017-05-11 ENCOUNTER — Ambulatory Visit: Payer: BLUE CROSS/BLUE SHIELD | Attending: Internal Medicine | Admitting: Internal Medicine

## 2017-05-11 VITALS — BP 137/95 | HR 75 | Temp 98.0°F | Resp 16 | Ht 67.0 in | Wt 277.8 lb

## 2017-05-11 DIAGNOSIS — E282 Polycystic ovarian syndrome: Secondary | ICD-10-CM | POA: Insufficient documentation

## 2017-05-11 DIAGNOSIS — G5601 Carpal tunnel syndrome, right upper limb: Secondary | ICD-10-CM

## 2017-05-11 DIAGNOSIS — Z79899 Other long term (current) drug therapy: Secondary | ICD-10-CM | POA: Diagnosis not present

## 2017-05-11 DIAGNOSIS — M5136 Other intervertebral disc degeneration, lumbar region: Secondary | ICD-10-CM | POA: Diagnosis not present

## 2017-05-11 DIAGNOSIS — I10 Essential (primary) hypertension: Secondary | ICD-10-CM | POA: Insufficient documentation

## 2017-05-11 DIAGNOSIS — R7303 Prediabetes: Secondary | ICD-10-CM | POA: Diagnosis not present

## 2017-05-11 DIAGNOSIS — J029 Acute pharyngitis, unspecified: Secondary | ICD-10-CM | POA: Insufficient documentation

## 2017-05-11 DIAGNOSIS — G4700 Insomnia, unspecified: Secondary | ICD-10-CM | POA: Insufficient documentation

## 2017-05-11 DIAGNOSIS — Z87891 Personal history of nicotine dependence: Secondary | ICD-10-CM | POA: Insufficient documentation

## 2017-05-11 DIAGNOSIS — G5603 Carpal tunnel syndrome, bilateral upper limbs: Secondary | ICD-10-CM | POA: Insufficient documentation

## 2017-05-11 DIAGNOSIS — Z7984 Long term (current) use of oral hypoglycemic drugs: Secondary | ICD-10-CM | POA: Diagnosis not present

## 2017-05-11 DIAGNOSIS — Z8709 Personal history of other diseases of the respiratory system: Secondary | ICD-10-CM | POA: Diagnosis not present

## 2017-05-11 LAB — GLUCOSE, POCT (MANUAL RESULT ENTRY): POC Glucose: 116 mg/dl — AB (ref 70–99)

## 2017-05-11 LAB — POCT GLYCOSYLATED HEMOGLOBIN (HGB A1C): Hemoglobin A1C: 6.1

## 2017-05-11 MED ORDER — GABAPENTIN 300 MG PO CAPS: 600.0000 mg | ORAL_CAPSULE | Freq: Every day | ORAL | 3 refills | Status: DC

## 2017-05-11 NOTE — Progress Notes (Signed)
Patient ID: Cheryl Manning, female    DOB: Sep 02, 1980  MRN: [id number]  CC: Follow-up (6 week)   Subjective: Cheryl Manning is a 37 y.o. female who presents for chronic ds management.  I last saw her 09/2016 Her concerns today include:  HTN, CTS, seasonal allergies, pre-DM, PCOS, CTS, DDD lumbar  Wants to see Dr. Alvis Lemmings whom she saw last visit.  States she requested the change but they still schedule appt with me.  She plans to have this corrected when she leaves today  1.  CTS: Gabapentin helps but would like to have increase to BID.  2.  C/o insomnia:  Gets in bed around 1 a.m Gets off work at 11:30 p.m Sometimes she uses her phone for a little while Can take up to 4-5 hrs to fall asleep. Does not drink any caffinated beverages in the evenings. Gets up at 7 a.m to got to school until 1 p.m.  Then has to be at work for 2:30 p.m.  She has not tried anything in the past to help with sleep.    3.  Pre-DM:  Taking Metformin.  "Doing so so with eating habits."  Eating late at nights when she gets off work.  No time to eat supper on her job. Does not have time to  take breaks  4.  Sore throat:  c/o chronic inflammation in throat for >10 yr -saw ENT 3 mths ago.  Reports being told she needs to have tonsils remove.  Thinks she may have infection now and wants abx Patient Active Problem List   Diagnosis Date Noted  . Prediabetes 01/27/2017  . ASCUS with positive high risk HPV cervical 11/10/2016  . Diabetes mellitus screening 10/07/2016  . Seasonal allergic rhinitis due to pollen 10/07/2016  . Lateral epicondylitis of both elbows 10/07/2016  . Bilateral carpal tunnel syndrome 10/07/2016  . Left breast lump 10/07/2016  . Secondary amenorrhea 10/07/2016  . HTN (hypertension) 06/24/2016     Current Outpatient Medications on File Prior to Visit  Medication Sig Dispense Refill  . cetirizine (ZYRTEC) 10 MG tablet Take 1 tablet (10 mg total) by mouth daily. 30 tablet 6  .  fluticasone (FLONASE) 50 MCG/ACT nasal spray Place 2 sprays into both nostrils daily. 16 g 6  . losartan-hydrochlorothiazide (HYZAAR) 100-12.5 MG tablet TAKE 1 TABLET BY MOUTH DAILY. 90 tablet 1  . medroxyPROGESTERone (PROVERA) 10 MG tablet Take one tab daily for 7 days the third week of every month to induce menstrual periods 7 tablet 6  . metFORMIN (GLUCOPHAGE) 500 MG tablet Take 2 tablets (1,000 mg total) by mouth daily with breakfast. Dose increase 90 tablet 3  . methocarbamol (ROBAXIN) 500 MG tablet Take 1 tablet (500 mg total) by mouth every 8 (eight) hours as needed for muscle spasms. 60 tablet 1  . naproxen (NAPROSYN) 500 MG tablet Take 1 tablet (500 mg total) by mouth 2 (two) times daily with a meal. 60 tablet 1  . diclofenac sodium (VOLTAREN) 1 % GEL Apply 2 g topically 4 (four) times daily. (Patient not taking: Reported on 05/08/2017) 100 g 2  . Polyethyl Glycol-Propyl Glycol (SYSTANE) 0.4-0.3 % GEL ophthalmic gel Place 1 application into both eyes at bedtime as needed. (Patient not taking: Reported on 05/11/2017) 1 Bottle 0   No current facility-administered medications on file prior to visit.     Allergies  Allergen Reactions  . Cinnamon Anaphylaxis    Social History   Socioeconomic History  . Marital status:  Single    Spouse name: Not on file  . Number of children: Not on file  . Years of education: Not on file  . Highest education level: Not on file  Social Needs  . Financial resource strain: Not on file  . Food insecurity - worry: Not on file  . Food insecurity - inability: Not on file  . Transportation needs - medical: Not on file  . Transportation needs - non-medical: Not on file  Occupational History  . Not on file  Tobacco Use  . Smoking status: Former Games developermoker  . Smokeless tobacco: Never Used  Substance and Sexual Activity  . Alcohol use: Not on file  . Drug use: Not on file  . Sexual activity: Not on file  Other Topics Concern  . Not on file  Social History  Narrative  . Not on file    Family History  Problem Relation Age of Onset  . Diabetes Father   . Hypertension Father   . Uterine cancer Sister   . Hypertension Brother   . Drug abuse Paternal Uncle   . Uterine cancer Maternal Grandmother   . Uterine cancer Paternal Grandmother   . Breast cancer Paternal Aunt     No past surgical history on file.  ROS: Review of Systems Negative except as above  PHYSICAL EXAM: BP (!) 137/95   Pulse 75   Temp 98 F (36.7 C) (Oral)   Resp 16   Ht 5\' 7"  (1.702 m)   Wt 277 lb 12.8 oz (126 kg)   LMP 04/10/2017   SpO2 97%   BMI 43.51 kg/m   Repeat: BP 120/90 Physical Exam General appearance - alert, well appearing, and in no distress Mental status - alert, oriented to person, place, and time, normal mood, behavior, speech, dress, motor activity, and thought processes Mouth - mucous membranes moist, pharynx normal without lesions or exudates Neck - supple, no significant adenopathy Chest - clear to auscultation, no wheezes, rales or rhonchi, symmetric air entry Heart - normal rate, regular rhythm, normal S1, S2, no murmurs, rubs, clicks or gallops   BS 116 A1C 6.1 ASSESSMENT AND PLAN: 1. Carpal tunnel syndrome of right wrist - gabapentin (NEURONTIN) 300 MG capsule; Take 2 capsules (600 mg total) by mouth at bedtime.  Dispense: 60 capsule; Refill: 3  2. Insomnia, unspecified type Discussed good sleep hygiene.  The increased dose of gabapentin may help with sleep.  She may also try melatonin over-the-counter.  3. Prediabetes Discussed on encourage healthy eating habits. - POCT glucose (manual entry) - POCT glycosylated hemoglobin (Hb A1C)  4. Essential hypertension At goal.  Continue Cozaar/HCTZ - Microalbumin / creatinine urine ratio  5. History of sore throat Reassurance given.  She does not have strep throat at this time.  Advised to gargle with warm water and salt  Patient was given the opportunity to ask questions.  Patient  verbalized understanding of the plan and was able to repeat key elements of the plan.   Orders Placed This Encounter  Procedures  . Microalbumin / creatinine urine ratio  . POCT glucose (manual entry)  . POCT glycosylated hemoglobin (Hb A1C)     Requested Prescriptions   Signed Prescriptions Disp Refills  . gabapentin (NEURONTIN) 300 MG capsule 60 capsule 3    Sig: Take 2 capsules (600 mg total) by mouth at bedtime.    Return in about 3 months (around 08/09/2017) for Amao.  Jonah Blueeborah Brystol Wasilewski, MD, FACP

## 2017-05-11 NOTE — Patient Instructions (Signed)
Insomnio  (Insomnia)  El insomnio es un trastorno del sueo que causa dificultades para conciliar el sueo o para mantenerlo. Puede producir cansancio (fatiga), falta de energa, dificultad para concentrarse, cambios en el estado de nimo y mal rendimiento escolar o laboral.  Hay tres formas diferentes de clasificar el insomnio:   Dificultad para conciliar el sueo.   Dificultad para mantener el sueo.   Despertar muy precoz por la maana.  Cualquier tipo de insomnio puede ser a largo plazo (crnico) o a corto plazo (agudo). Ambos son frecuentes. Generalmente, el insomnio a corto plazo dura tres meses o menos tiempo. El crnico ocurre al menos tres veces por semana durante ms de tres meses.  CAUSAS  El insomnio puede deberse a otra afeccin, situacin o sustancia, por ejemplo:   Ansiedad.   Algunos medicamentos.   Enfermedad por reflujo gastroesofgico (ERGE) u otras enfermedades gastrointestinales.   Asma y otras enfermedades respiratorias.   Sndrome de las piernas inquietas, apnea del sueo u otros trastornos del sueo.   Dolor crnico.   Menopausia, que puede incluir calores repentinos.   Ictus.   Consumo excesivo de alcohol, tabaco u drogas ilegales.   Depresin.   Cafena.   Trastornos neurolgicos, como enfermedad de Alzheimer.   Hiperactividad tiroidea (hipertiroidismo).  Es posible que la causa del insomnio no se conozca.  FACTORES DE RIESGO  Los factores de riesgo de tener insomnio incluyen lo siguiente:   El sexo. La mujeres se ven ms afectadas que los hombres.   La edad. El insomnio es ms frecuente a medida que una persona envejece.   El estrs. Esto puede incluir su vida profesional o personal.   Los ingresos. El insomnio es ms frecuente en las personas cuyos ingresos son ms bajos.   La falta de actividad fsica.   Los horarios de trabajo irregulares o los turnos nocturnos.   Los viajes a lugares de diferentes zonas horarias.  SIGNOS Y SNTOMAS  Si tiene insomnio, el sntoma  principal es la dificultad para conciliar el sueo o mantenerlo. Esto puede derivar en otros sntomas, por ejemplo:   Sentirse fatigado.   Ponerse nervioso por tener que irse a dormir.   No sentirse descansado por la maana.   Tener dificultad para concentrarse.   Sentirse irritable, ansioso o deprimido.  TRATAMIENTO  El tratamiento para el insomnio depende de la causa. Si se debe a una enfermedad preexistente, el tratamiento se centrar en el abordaje de la enfermedad. El tratamiento tambin puede incluir lo siguiente:   Medicamentos que lo ayuden a dormir.   Asesoramiento psicolgico o terapia.   Cambios en el estilo de vida.  INSTRUCCIONES PARA EL CUIDADO EN EL HOGAR   Tome los medicamentos solamente como se lo haya indicado el mdico.   Establezca horarios habituales para dormir y despertarse. No tome siestas.   Lleve un registro del sueo ya que podra ser de utilidad para que usted y a su mdico puedan determinar qu podra estar causndole insomnio. Incluya lo siguiente:  ? Cundo duerme.  ? Cundo se despierta durante la noche.  ? Qu tan bien duerme.  ? Qu tan relajado se siente al da siguiente.  ? Cualquier efecto secundario de los medicamentos que toma.  ? Lo que usted come y bebe.   Convierta a su habitacin en un lugar cmodo donde sea fcil conciliar el sueo:  ? Coloque persianas o cortinas especiales oscuras que impidan la entrada de la luz del exterior.  ? Para bloquear los ruidos, use   un aparato que reproduce sonidos ambientales o relajantes de fondo.  ? Mantenga baja la temperatura.   Haga ejercicio regularmente como se lo haya indicado el mdico. No haga ejercicio justo antes de la hora de acostarse.   Utilice tcnicas de relajacin para controlar el estrs. Pdale al mdico que le sugiera algunas tcnicas que sean adecuadas para usted. Estos pueden incluir lo siguiente:  ? Ejercicios de respiracin.  ? Rutinas para aliviar la tensin muscular.  ? Visualizacin de escenas  apacibles.   Disminucin del consumo de alcohol, bebidas con cafena y cigarrillos, especialmente cerca de la hora de acostarse, ya que pueden perturbarle el sueo.   No coma en exceso ni consuma comidas picantes justo antes de la hora de acostarse. Esto puede causarle molestias digestivas y dificultades para dormir.   Limite el uso de pantallas antes de la hora de acostarse. Esto incluye lo siguiente:  ? Mirar televisin.  ? Usar el telfono inteligente, la tableta y la computadora.   Siga una rutina. Esto puede ayudarlo a conciliar el sueo ms rpidamente. Intente hacer una actividad tranquila, cepillarse los dientes e irse a la cama a la misma hora todas las noches.   Levntese de la cama si sigue despierto despus de 15minutos de haber intentado dormirse. Mantenga bajas las luces, pero intente leer o hacer una actividad tranquila. Cuando tenga sueo, regrese a la cama.   Conduzca con cuidado. No conduzca si est muy somnoliento.   Concurra a todas las visitas de control, segn le indique su mdico. Esto es importante.  SOLICITE ATENCIN MDICA SI:   Est cansado durante el da o tiene dificultades en su rutina diaria debido a la somnolencia.   Sigue teniendo problemas para dormir o estos empeoran.  SOLICITE ATENCIN MDICA DE INMEDIATO SI:   Tiene pensamientos serios acerca de lastimarse a usted mismo o daar a otra persona.  Esta informacin no tiene como fin reemplazar el consejo del mdico. Asegrese de hacerle al mdico cualquier pregunta que tenga.  Document Released: 04/07/2005 Document Revised: 04/28/2014 Document Reviewed: 01/06/2014  Elsevier Interactive Patient Education  2018 [employer] Inc[employer]

## 2017-05-12 LAB — MICROALBUMIN / CREATININE URINE RATIO
CREATININE, UR: 128.6 mg/dL
MICROALB/CREAT RATIO: 9.3 mg/g{creat} (ref 0.0–30.0)
MICROALBUM., U, RANDOM: 12 ug/mL

## 2017-05-22 ENCOUNTER — Ambulatory Visit: Payer: BLUE CROSS/BLUE SHIELD | Attending: Physician Assistant | Admitting: Physical Therapy

## 2017-05-22 DIAGNOSIS — M62830 Muscle spasm of back: Secondary | ICD-10-CM | POA: Insufficient documentation

## 2017-05-22 DIAGNOSIS — M545 Low back pain: Secondary | ICD-10-CM | POA: Insufficient documentation

## 2017-05-22 DIAGNOSIS — G8929 Other chronic pain: Secondary | ICD-10-CM | POA: Insufficient documentation

## 2017-05-25 ENCOUNTER — Ambulatory Visit: Payer: BLUE CROSS/BLUE SHIELD | Admitting: Physical Therapy

## 2017-05-25 ENCOUNTER — Encounter: Payer: Self-pay | Admitting: Physical Therapy

## 2017-05-25 DIAGNOSIS — G8929 Other chronic pain: Secondary | ICD-10-CM

## 2017-05-25 DIAGNOSIS — M545 Low back pain: Secondary | ICD-10-CM | POA: Diagnosis not present

## 2017-05-25 DIAGNOSIS — M62830 Muscle spasm of back: Secondary | ICD-10-CM | POA: Diagnosis present

## 2017-05-25 NOTE — Patient Instructions (Signed)
BACK: Child's Pose (Sciatica)    Sit in knee-chest position and reach arms forward. Separate knees for comfort. Hold position for _3__ breaths. Repeat __3_ times. Do __2_ times per day.  Copyright  VHI. All rights reserved.

## 2017-05-25 NOTE — Therapy (Signed)
[facility] [address] [place], [place], [address] Phone: [phone]   Fax:  [phone]  Physical Therapy Treatment  Patient Details  Name: Cheryl LollCinthia Garcia-Muniz MRN: [id number] Date of Birth: 05-Dec-1980 Referring Provider: Anders SimmondsMcClung, Angela M, PA-C   Encounter Date: 05/25/2017  PT End of Session - 05/25/17 0940    Visit Number  2    Number of Visits  13    Date for PT Re-Evaluation  06/19/17    PT Start Time  0935    PT Stop Time  1024    PT Time Calculation (min)  49 min    Activity Tolerance  Patient tolerated treatment well    Behavior During Therapy  [facility] for tasks assessed/performed       Past Medical History:  Diagnosis Date  . HTN (hypertension) 06/24/2016  . Prediabetes 01/27/2017    History reviewed. No pertinent surgical history.  There were no vitals filed for this visit.  Subjective Assessment - 05/25/17 0935    Subjective  Pain 7/10-8/10 today.  Has tried the exercises a couple times.     Currently in Pain?  Yes    Pain Score  8     Pain Location  Back    Pain Orientation  Lower    Pain Descriptors / Indicators  Tightness    Pain Type  Acute pain    Pain Onset  More than a month ago    Pain Frequency  Constant         [facility] Adult PT Treatment/Exercise - 05/25/17 0001      Lumbar Exercises: Stretches   Active Hamstring Stretch  2 reps;30 seconds;Left;Right    Single Knee to Chest Stretch  2 reps PT assist     Lower Trunk Rotation  10 seconds head turns opp x 10, arms wide     Lumbar Stabilization Level 1  10 seconds clam bilateral and unilateral x 10       Lumbar Exercises: Aerobic   Nustep  L3 UE and LE for 6 min warm up      Lumbar Exercises: Supine   Pelvic Tilt  10 reps;5 seconds      Lumbar Exercises: Quadruped   Madcat/Old Horse  5 reps    Other Quadruped Lumbar Exercises  childs pose x 3, 30 sec, then lateral x 1       Moist Heat Therapy   Number Minutes Moist Heat  10 Minutes    Moist Heat Location  Lumbar Spine      Manual Therapy   Manual Therapy  Joint mobilization;Myofascial release    Joint Mobilization  P/A thoracic and lumbar spine              PT Education - 05/25/17 1016    Education provided  Yes    Education Details  Quadruped stretching for low back stretching     Person(s) Educated  Patient    Methods  Explanation    Comprehension  Verbalized understanding       PT Short Term Goals - 05/25/17 1123      PT SHORT TERM GOAL #1   Title  Pt will be I with inital HEP    Status  On-going      PT SHORT TERM GOAL #2   Title  pt will demo proper posture with sitting/ standing and transfers as well as appropriate lifting mechanics to prevent and reduce low back pain     Status  On-going  PT SHORT TERM GOAL #3   Title  pt to reduce back spasm to reduce pain to </= 5/10 and promote trunk mobility and therapuetic progression     Status  On-going        PT Long Term Goals - 05/08/17 1203      PT LONG TERM GOAL #1   Title  pt to increase trunk flexion to >/= 60 degrees and extension/ bil side bending by >/= 8 degrees for functional mobility     Time  6    Period  Weeks    Status  New    Target Date  06/19/17      PT LONG TERM GOAL #2   Title  pt to be able to sit/ stand and walk >/= 45 min with </= 2/10 pain for functional endurance required for work related actiivties and ADLs    Time  6    Period  Weeks    Status  New    Target Date  06/19/17      PT LONG TERM GOAL #3   Title  Increase FOTO score to </= 39% limited to demo improvement in function    Time  6    Period  Weeks    Status  New    Target Date  06/19/17      PT LONG TERM GOAL #4   Title  pt to be I with all HEP given and is able to maintain current level of funciton independently    Time  6    Period  Weeks    Status  New    Target Date  06/19/17            Plan - 05/25/17 0941    Clinical Impression Statement  Pt needed min cues for HEP, offered  alternative for back stretching (quadruped).  Pt was able to stretch effectively and reduce pain significantly.  Began to introduce stabilization concepts with max cueing to avoid overrecruiting with glutes, obliques.     PT Treatment/Interventions  ADLs/Self Care Home Management;Cryotherapy;Electrical Stimulation;Iontophoresis 4mg /ml Dexamethasone;Moist Heat;Ultrasound;Neuromuscular re-education;Manual techniques;Therapeutic exercise;Therapeutic activities;Dry needling;Taping    PT Next Visit Plan   discuss DN, manual for low back, stretching of hip flexors/ hamstrings, core strength, modalities for pain PRN    PT Home Exercise Plan  lower trunk rotation, hamstring stretching, quadruped childs pose, posterior pelvic tilt.    Consulted and Agree with Plan of Care  Patient       Patient will benefit from skilled therapeutic intervention in order to improve the following deficits and impairments:  Pain, Improper body mechanics, Postural dysfunction, Hypomobility, Increased fascial restricitons, Increased muscle spasms, Decreased activity tolerance, Decreased endurance, Decreased range of motion, Obesity  Visit Diagnosis: Chronic bilateral low back pain, with sciatica presence unspecified  Muscle spasm of back     Problem List Patient Active Problem List   Diagnosis Date Noted  . Prediabetes 01/27/2017  . ASCUS with positive high risk HPV cervical 11/10/2016  . Diabetes mellitus screening 10/07/2016  . Seasonal allergic rhinitis due to pollen 10/07/2016  . Lateral epicondylitis of both elbows 10/07/2016  . Bilateral carpal tunnel syndrome 10/07/2016  . Left breast lump 10/07/2016  . Secondary amenorrhea 10/07/2016  . HTN (hypertension) 06/24/2016    PAA,JENNIFER 05/25/2017, 11:30 AM  [facility] [address] [place], [place], [address] Phone: [phone]   Fax:  [phone]  Name: Claryce Friel MRN: [id number] Date of  Birth: Sep 12, 1980  Karie Mainland,  PT 05/25/17 11:30 AM Phone: [phone] Fax: [phone]

## 2017-05-26 MED FILL — LOSARTAN-HCTZ 100-12.5 MG T: 100-12.5 | 30 days supply | Qty: 30 | Fill #5

## 2017-05-26 MED FILL — NAPROXEN 500 MG TABLET: 500 | 30 days supply | Qty: 60 | Fill #1

## 2017-05-27 ENCOUNTER — Encounter: Payer: BLUE CROSS/BLUE SHIELD | Admitting: Physical Therapy

## 2017-05-28 MED FILL — GABAPENTIN 300 MG CAPSULE: 300 | 30 days supply | Qty: 60 | Fill #0

## 2017-06-01 ENCOUNTER — Encounter: Payer: BLUE CROSS/BLUE SHIELD | Admitting: Physical Therapy

## 2017-06-02 ENCOUNTER — Encounter: Payer: Self-pay | Admitting: Physical Therapy

## 2017-06-02 ENCOUNTER — Ambulatory Visit: Payer: BLUE CROSS/BLUE SHIELD | Admitting: Physical Therapy

## 2017-06-02 DIAGNOSIS — M545 Low back pain: Secondary | ICD-10-CM | POA: Diagnosis not present

## 2017-06-02 DIAGNOSIS — G8929 Other chronic pain: Secondary | ICD-10-CM

## 2017-06-02 DIAGNOSIS — M62830 Muscle spasm of back: Secondary | ICD-10-CM

## 2017-06-02 NOTE — Therapy (Signed)
[facility] [address] [place], [place], [address] Phone: [phone]   Fax:  [phone]  Physical Therapy Treatment  Patient Details  Name: Cheryl Manning MRN: [id number] Date of Birth: 18-Aug-1980 Referring Provider: Anders Simmonds, PA-C   Encounter Date: 06/02/2017  PT End of Session - 06/02/17 1759    Visit Number  3    Number of Visits  13    Date for PT Re-Evaluation  06/19/17    PT Start Time  1503    PT Stop Time  1600    PT Time Calculation (min)  57 min    Activity Tolerance  Patient tolerated treatment well    Behavior During Therapy  [facility] for tasks assessed/performed       Past Medical History:  Diagnosis Date  . HTN (hypertension) 06/24/2016  . Prediabetes 01/27/2017    History reviewed. No pertinent surgical history.  There were no vitals filed for this visit.  Subjective Assessment - 06/02/17 1508    Subjective  pain 7/10.  Some days I dont have any pain Muscle relaxers do not help.     Patient is accompained by:  Interpreter not sure who other person was    Pain Descriptors / Indicators  Sharp    Aggravating Factors   bending,  sitting too long ,  Sit on toilat seat    Pain Relieving Factors  change of position,     Effect of Pain on Daily Activities  limited sitting,  limitr                      [facility] Adult PT Treatment/Exercise - 06/02/17 0001      Therapeutic Activites    Therapeutic Activities  Lifting    Lifting  Practiced lifting 10 LB kettle bell from knee height.  Mod cues given.  due to patient increases lumbar lordosis with standing,  Cues helped .  she needs more practice.        Lumbar Exercises: Stretches   Passive Hamstring Stretch  3 reps;30 seconds    Single Knee to Chest Stretch  2 reps;20 seconds      Lumbar Exercises: Supine   Pelvic Tilt  10 reps;5 seconds    Other Supine Lumbar Exercises  sit to stand 10 x without increasing back pain    Other Supine  Lumbar Exercises  decompression series,  2 pillows needed right arm,  all practiced,  Did not add to hep due to it increased sacral pain.  and rib pain right anf arm pain right.        Modalities   Modalities  Cryotherapy      Cryotherapy   Number Minutes Cryotherapy  15 Minutes    Cryotherapy Location  Lumbar Spine    Type of Cryotherapy  -- cold pack      Manual Therapy   Manual therapy comments  instrument assist gluteals Rt> LT and lower back,  pain decreased as tissue softened.  Suggested tennis ball for soft tissue .at home.  Patient has one already.             PT Education - 06/02/17 1758    Education provided  Yes    Education Details  lifting technique    Person(s) Educated  Patient;Other (comment)    Methods  Explanation;Demonstration;Verbal cues    Comprehension  Verbalized understanding;Need further instruction;Returned demonstration       PT Short Term Goals - 05/25/17 1123  PT SHORT TERM GOAL #1   Title  Pt will be I with inital HEP    Status  On-going      PT SHORT TERM GOAL #2   Title  pt will demo proper posture with sitting/ standing and transfers as well as appropriate lifting mechanics to prevent and reduce low back pain     Status  On-going      PT SHORT TERM GOAL #3   Title  pt to reduce back spasm to reduce pain to </= 5/10 and promote trunk mobility and therapuetic progression     Status  On-going        PT Long Term Goals - 05/08/17 1203      PT LONG TERM GOAL #1   Title  pt to increase trunk flexion to >/= 60 degrees and extension/ bil side bending by >/= 8 degrees for functional mobility     Time  6    Period  Weeks    Status  New    Target Date  06/19/17      PT LONG TERM GOAL #2   Title  pt to be able to sit/ stand and walk >/= 45 min with </= 2/10 pain for functional endurance required for work related actiivties and ADLs    Time  6    Period  Weeks    Status  New    Target Date  06/19/17      PT LONG TERM GOAL #3    Title  Increase FOTO score to </= 39% limited to demo improvement in function    Time  6    Period  Weeks    Status  New    Target Date  06/19/17      PT LONG TERM GOAL #4   Title  pt to be I with all HEP given and is able to maintain current level of funciton independently    Time  6    Period  Weeks    Status  New    Target Date  06/19/17            Plan - 06/02/17 1759    Clinical Impression Statement  Patient was able to lie flat with legs straight with no pain increase at end of session ( Painful initially).  No new HEP added due to fact they seemed to irritate pain.  Patient needed moderate cues for lifting  she was unable to lift 10 pounds without increased pain.   Patient  said she felt good after session.    PT Next Visit Plan   discuss DN, manual for low back, stretching of hip flexors/ hamstrings, core strength, modalities for pain PRN,  lifting practice when ready    PT Home Exercise Plan  lower trunk rotation, hamstring stretching, quadruped childs pose, posterior pelvic tilt.    Consulted and Agree with Plan of Care  Patient       Patient will benefit from skilled therapeutic intervention in order to improve the following deficits and impairments:     Visit Diagnosis: Chronic bilateral low back pain, with sciatica presence unspecified  Muscle spasm of back     Problem List Patient Active Problem List   Diagnosis Date Noted  . Prediabetes 01/27/2017  . ASCUS with positive high risk HPV cervical 11/10/2016  . Diabetes mellitus screening 10/07/2016  . Seasonal allergic rhinitis due to pollen 10/07/2016  . Lateral epicondylitis of both elbows 10/07/2016  . Bilateral carpal tunnel syndrome 10/07/2016  .  Left breast lump 10/07/2016  . Secondary amenorrhea 10/07/2016  . HTN (hypertension) 06/24/2016    Pahoua Schreiner PTA 06/02/2017, 6:11 PM  [facility] [address] [place], [place], [address] Phone:  [phone]   Fax:  [phone]  Name: Cheryl Manning MRN: [id number] Date of Birth: [birth year]

## 2017-06-02 NOTE — Therapy (Signed)
[facility] [address] [place], [place], [address] Phone: [phone]   Fax:  [phone]  Physical Therapy Treatment  Patient Details  Name: Cheryl LollCinthia Manning MRN: [id number] Date of Birth: Sep 16, 1980 Referring Provider: Anders SimmondsMcClung, Angela M, PA-C   Encounter Date: 06/02/2017  PT End of Session - 06/02/17 1759    Visit Number  3    Number of Visits  13    Date for PT Re-Evaluation  06/19/17    PT Start Time  1503    PT Stop Time  1600    PT Time Calculation (min)  57 min    Activity Tolerance  Patient tolerated treatment well    Behavior During Therapy  [facility] for tasks assessed/performed       Past Medical History:  Diagnosis Date  . HTN (hypertension) 06/24/2016  . Prediabetes 01/27/2017    History reviewed. No pertinent surgical history.  There were no vitals filed for this visit.  Subjective Assessment - 06/02/17 1508    Subjective  pain 7/10.  Some days I dont have any pain Muscle relaxers do not help.     Pain Descriptors / Indicators  Sharp    Aggravating Factors   bending,  sitting too long ,  Sit on toilat seat    Pain Relieving Factors  change of position,     Effect of Pain on Daily Activities  limited sitting,  limitr                      [facility] Adult PT Treatment/Exercise - 06/02/17 0001      Therapeutic Activites    Therapeutic Activities  Lifting    Lifting  Practiced lifting 10 LB kettle bell from knee height.  Mod cues given.  due to patient increases lumbar lordosis with standing,  Cues helped .  she needs more practice.        Lumbar Exercises: Stretches   Passive Hamstring Stretch  3 reps;30 seconds    Single Knee to Chest Stretch  2 reps;20 seconds      Lumbar Exercises: Supine   Pelvic Tilt  10 reps;5 seconds    Other Supine Lumbar Exercises  sit to stand 10 x without increasing back pain    Other Supine Lumbar Exercises  decompression series,  2 pillows needed right arm,  all  practiced,  Did not add to hep due to it increased sacral pain.  and rib pain right anf arm pain right.        Modalities   Modalities  Cryotherapy      Cryotherapy   Number Minutes Cryotherapy  15 Minutes    Cryotherapy Location  Lumbar Spine    Type of Cryotherapy  -- cold pack      Manual Therapy   Manual therapy comments  instrument assist gluteals Rt> LT and lower back,  pain decreased as tissue softened.  Suggested tennis ball for soft tissue .at home.  Patient has one already.             PT Education - 06/02/17 1758    Education provided  Yes    Education Details  lifting technique    Person(s) Educated  Patient;Other (comment)    Methods  Explanation;Demonstration;Verbal cues    Comprehension  Verbalized understanding;Need further instruction;Returned demonstration       PT Short Term Goals - 05/25/17 1123      PT SHORT TERM GOAL #1   Title  Pt will  be I with inital HEP    Status  On-going      PT SHORT TERM GOAL #2   Title  pt will demo proper posture with sitting/ standing and transfers as well as appropriate lifting mechanics to prevent and reduce low back pain     Status  On-going      PT SHORT TERM GOAL #3   Title  pt to reduce back spasm to reduce pain to </= 5/10 and promote trunk mobility and therapuetic progression     Status  On-going        PT Long Term Goals - 05/08/17 1203      PT LONG TERM GOAL #1   Title  pt to increase trunk flexion to >/= 60 degrees and extension/ bil side bending by >/= 8 degrees for functional mobility     Time  6    Period  Weeks    Status  New    Target Date  06/19/17      PT LONG TERM GOAL #2   Title  pt to be able to sit/ stand and walk >/= 45 min with </= 2/10 pain for functional endurance required for work related actiivties and ADLs    Time  6    Period  Weeks    Status  New    Target Date  06/19/17      PT LONG TERM GOAL #3   Title  Increase FOTO score to </= 39% limited to demo improvement in  function    Time  6    Period  Weeks    Status  New    Target Date  06/19/17      PT LONG TERM GOAL #4   Title  pt to be I with all HEP given and is able to maintain current level of funciton independently    Time  6    Period  Weeks    Status  New    Target Date  06/19/17            Plan - 06/02/17 1759    Clinical Impression Statement  Patient was able to lie flat with legs straight with no pain increase at end of session ( Painful initially).  No new HEP added due to fact they seemed to irritate pain.  Patient needed moderate cues for lifting  she was unable to lift 10 pounds without increased pain.   Patient  said she felt good after session.    PT Next Visit Plan   discuss DN, manual for low back, stretching of hip flexors/ hamstrings, core strength, modalities for pain PRN,  lifting practice when ready    PT Home Exercise Plan  lower trunk rotation, hamstring stretching, quadruped childs pose, posterior pelvic tilt.    Consulted and Agree with Plan of Care  Patient       Patient will benefit from skilled therapeutic intervention in order to improve the following deficits and impairments:     Visit Diagnosis: Chronic bilateral low back pain, with sciatica presence unspecified  Muscle spasm of back     Problem List Patient Active Problem List   Diagnosis Date Noted  . Prediabetes 01/27/2017  . ASCUS with positive high risk HPV cervical 11/10/2016  . Diabetes mellitus screening 10/07/2016  . Seasonal allergic rhinitis due to pollen 10/07/2016  . Lateral epicondylitis of both elbows 10/07/2016  . Bilateral carpal tunnel syndrome 10/07/2016  . Left breast lump 10/07/2016  . Secondary amenorrhea 10/07/2016  .  HTN (hypertension) 06/24/2016    Evangelene Vora PTA 06/02/2017, 6:05 PM  [facility] [address] [place], [place], [address] Phone: [phone]   Fax:  [phone]  Name: Cheryl Manning MRN:  [id number] Date of Birth: 1980-10-26

## 2017-06-03 ENCOUNTER — Encounter: Payer: BLUE CROSS/BLUE SHIELD | Admitting: Physical Therapy

## 2017-06-08 ENCOUNTER — Encounter: Payer: Self-pay | Admitting: Physical Therapy

## 2017-06-08 ENCOUNTER — Ambulatory Visit: Payer: BLUE CROSS/BLUE SHIELD | Admitting: Physical Therapy

## 2017-06-08 ENCOUNTER — Encounter: Payer: BLUE CROSS/BLUE SHIELD | Admitting: Physical Therapy

## 2017-06-08 DIAGNOSIS — M62830 Muscle spasm of back: Secondary | ICD-10-CM

## 2017-06-08 DIAGNOSIS — M545 Low back pain: Principal | ICD-10-CM

## 2017-06-08 DIAGNOSIS — G8929 Other chronic pain: Secondary | ICD-10-CM

## 2017-06-08 NOTE — Therapy (Signed)
[facility] [address] [place], [place], [address] Phone: [phone]   Fax:  [phone]  Physical Therapy Treatment  Patient Details  Name: Cheryl Manning MRN: [id number] Date of Birth: [birth year] Referring Provider: Anders Simmonds, PA-C   Encounter Date: 06/08/2017  PT End of Session - 06/08/17 1537    Visit Number  4    Number of Visits  13    Date for PT Re-Evaluation  06/19/17    PT Start Time  1501    PT Stop Time  1549    PT Time Calculation (min)  48 min    Activity Tolerance  Patient tolerated treatment well    Behavior During Therapy  [facility] for tasks assessed/performed       Past Medical History:  Diagnosis Date  . HTN (hypertension) 06/24/2016  . Prediabetes 01/27/2017    History reviewed. No pertinent surgical history.  There were no vitals filed for this visit.  Subjective Assessment - 06/08/17 1504    Subjective  " good there is still some pain doing my job"    Currently in Pain?  Yes    Pain Score  8     Pain Location  Back    Pain Descriptors / Indicators  Sharp    Pain Type  Acute pain    Pain Onset  More than a month ago    Pain Frequency  Constant                      [facility] Adult PT Treatment/Exercise - 06/08/17 1513      Lumbar Exercises: Stretches   Hip Flexor Stretch  30 seconds;4 reps;Left;Right      Lumbar Exercises: Aerobic   Nustep  L3 UE and LE for 6 min warm up      Lumbar Exercises: Supine   Pelvic Tilt  10 reps;5 seconds in decompression position.    Other Supine Lumbar Exercises  sit to stand 10 x       Lumbar Exercises: Quadruped   [place]/Old Horse  10 reps    Other Quadruped Lumbar Exercises  childs pose x 3, 30 sec,  cues for proper form      Cryotherapy   Number Minutes Cryotherapy  10 Minutes    Cryotherapy Location  Lumbar Spine    Type of Cryotherapy  -- in decompression position      Manual Therapy   Manual Therapy  Manual Traction;Soft  tissue mobilization;Myofascial release    Manual therapy comments  skilled palpation and monitoring pt through DN treatment    Soft tissue mobilization  IASTM over bil lumbar paraspinals    Myofascial Release  bil lumbar fascial rolling/ stretching    Manual Traction  intermittent lumbar traction x 5 min  reported decreased pressure/ pain       Trigger Point Dry Needling - 06/08/17 1516    Consent Given?  Yes    Education Handout Provided  Yes    Muscles Treated Upper Body  Longissimus    Longissimus Response  Twitch response elicited;Palpable increased muscle length L5 - L2 bil lumbar paraspinals           PT Education - 06/08/17 1537    Education provided  Yes    Education Details  DN education regarding benefits and after care.     Person(s) Educated  Patient;Other (comment) interpreter    Methods  Explanation;Verbal cues    Comprehension  Verbalized understanding;Verbal cues required  PT Short Term Goals - 05/25/17 1123      PT SHORT TERM GOAL #1   Title  Pt will be I with inital HEP    Status  On-going      PT SHORT TERM GOAL #2   Title  pt will demo proper posture with sitting/ standing and transfers as well as appropriate lifting mechanics to prevent and reduce low back pain     Status  On-going      PT SHORT TERM GOAL #3   Title  pt to reduce back spasm to reduce pain to </= 5/10 and promote trunk mobility and therapuetic progression     Status  On-going        PT Long Term Goals - 05/08/17 1203      PT LONG TERM GOAL #1   Title  pt to increase trunk flexion to >/= 60 degrees and extension/ bil side bending by >/= 8 degrees for functional mobility     Time  6    Period  Weeks    Status  New    Target Date  06/19/17      PT LONG TERM GOAL #2   Title  pt to be able to sit/ stand and walk >/= 45 min with </= 2/10 pain for functional endurance required for work related actiivties and ADLs    Time  6    Period  Weeks    Status  New    Target Date   06/19/17      PT LONG TERM GOAL #3   Title  Increase FOTO score to </= 39% limited to demo improvement in function    Time  6    Period  Weeks    Status  New    Target Date  06/19/17      PT LONG TERM GOAL #4   Title  pt to be I with all HEP given and is able to maintain current level of funciton independently    Time  6    Period  Weeks    Status  New    Target Date  06/19/17            Plan - 06/08/17 1538    Clinical Impression Statement  pt reports continued 8/10 pain inthe low back with minimal relief. Edcuated and performed TPDN along bil L5-L1 lumbar multifidus. Followed DN with IASTM techniques and manual traction which she responded with relief of pain. continued working on core strengthening in decompression position which she performed well. ice pack post session for muscle soreness.      PT Treatment/Interventions  ADLs/Self Care Home Management;Cryotherapy;Electrical Stimulation;Iontophoresis 4mg /ml Dexamethasone;Moist Heat;Ultrasound;Neuromuscular re-education;Manual techniques;Therapeutic exercise;Therapeutic activities;Dry needling;Taping    PT Next Visit Plan  response to DN,  manual for low back, stretching of hip flexors/ hamstrings, core strength, modalities for pain PRN,  lifting practice when ready    PT Home Exercise Plan  lower trunk rotation, hamstring stretching, quadruped childs pose, posterior pelvic tilt.    Consulted and Agree with Plan of Care  Patient       Patient will benefit from skilled therapeutic intervention in order to improve the following deficits and impairments:  Pain, Improper body mechanics, Postural dysfunction, Hypomobility, Increased fascial restricitons, Increased muscle spasms, Decreased activity tolerance, Decreased endurance, Decreased range of motion, Obesity  Visit Diagnosis: Chronic bilateral low back pain, with sciatica presence unspecified  Muscle spasm of back     Problem List Patient Active Problem List  Diagnosis Date Noted  . Prediabetes 01/27/2017  . ASCUS with positive high risk HPV cervical 11/10/2016  . Diabetes mellitus screening 10/07/2016  . Seasonal allergic rhinitis due to pollen 10/07/2016  . Lateral epicondylitis of both elbows 10/07/2016  . Bilateral carpal tunnel syndrome 10/07/2016  . Left breast lump 10/07/2016  . Secondary amenorrhea 10/07/2016  . HTN (hypertension) 06/24/2016   Lulu RidingKristoffer Ray Glacken PT, DPT, LAT, ATC  06/08/17  3:45 PM      [facility] [address] [place], [place], [address] Phone: [phone]   Fax:  [phone]  Name: Cheryl Manning MRN: [id number] Date of Birth: [birth year]

## 2017-06-10 ENCOUNTER — Encounter: Payer: BLUE CROSS/BLUE SHIELD | Admitting: Physical Therapy

## 2017-06-11 ENCOUNTER — Encounter: Payer: Self-pay | Admitting: Physical Therapy

## 2017-06-11 ENCOUNTER — Ambulatory Visit: Payer: BLUE CROSS/BLUE SHIELD | Admitting: Physical Therapy

## 2017-06-11 DIAGNOSIS — M545 Low back pain: Secondary | ICD-10-CM | POA: Diagnosis not present

## 2017-06-11 DIAGNOSIS — M62830 Muscle spasm of back: Secondary | ICD-10-CM

## 2017-06-11 DIAGNOSIS — G8929 Other chronic pain: Secondary | ICD-10-CM

## 2017-06-11 NOTE — Therapy (Signed)
[facility] [address] [place], [place], [address] Phone: [phone]   Fax:  [phone]  Physical Therapy Treatment  Patient Details  Name: Cheryl Manning MRN: [id number] Date of Birth: 11-Jan-1981 Referring Provider: Anders SimmondsMcClung, Angela M, PA-C   Encounter Date: 06/11/2017  PT End of Session - 06/11/17 1613    Visit Number  5    Number of Visits  13    Date for PT Re-Evaluation  06/19/17    PT Start Time  1545    PT Stop Time  1630    PT Time Calculation (min)  45 min    Activity Tolerance  Patient tolerated treatment well    Behavior During Therapy  [facility] for tasks assessed/performed       Past Medical History:  Diagnosis Date  . HTN (hypertension) 06/24/2016  . Prediabetes 01/27/2017    History reviewed. No pertinent surgical history.  There were no vitals filed for this visit.  Subjective Assessment - 06/11/17 1550    Subjective  Pt liked the dry needling.  It helped her for a bit.  Felt the muscle relax.  Now 4/10.      Currently in Pain?  Yes    Pain Score  4     Pain Location  Back    Pain Orientation  Lower    Pain Descriptors / Indicators  Aching    Pain Onset  More than a month ago    Pain Frequency  Intermittent    Aggravating Factors   AM or after work, not during work time     Pain Relieving Factors  stretching, change positions           [facility] Treatment/Exercise - 06/11/17 0001      Lumbar Exercises: Stretches   Active Hamstring Stretch  2 reps;30 seconds    Single Knee to Chest Stretch  2 reps;30 seconds    Lower Trunk Rotation  10 seconds x 10     Pelvic Tilt  10 reps heavy cues to breathe and reduce effort     Other Lumbar Stretch Exercise  sink stretch 20 sec x 5       Traction   Type of Traction  Lumbar    Min (lbs)  70    Max (lbs)  90    Hold Time  60    Rest Time  15    Time  15 min              PT Education - 06/11/17 1614    Education provided  Yes    Education Details  traction, stretching    Person(s) Educated  Patient    Methods  Explanation    Comprehension  Verbalized understanding       PT Short Term Goals - 06/11/17 1600      PT SHORT TERM GOAL #1   Title  Pt will be I with inital HEP      PT SHORT TERM GOAL #2   Title  pt will demo proper posture with sitting/ standing and transfers as well as appropriate lifting mechanics to prevent and reduce low back pain     Status  On-going      PT SHORT TERM GOAL #3   Title  pt to reduce back spasm to reduce pain to </= 5/10 and promote trunk mobility and therapuetic progression     Status  On-going        PT Long Term Goals -  05/08/17 1203      PT LONG TERM GOAL #1   Title  pt to increase trunk flexion to >/= 60 degrees and extension/ bil side bending by >/= 8 degrees for functional mobility     Time  6    Period  Weeks    Status  New    Target Date  06/19/17      PT LONG TERM GOAL #2   Title  pt to be able to sit/ stand and walk >/= 45 min with </= 2/10 pain for functional endurance required for work related actiivties and ADLs    Time  6    Period  Weeks    Status  New    Target Date  06/19/17      PT LONG TERM GOAL #3   Title  Increase FOTO score to </= 39% limited to demo improvement in function    Time  6    Period  Weeks    Status  New    Target Date  06/19/17      PT LONG TERM GOAL #4   Title  pt to be I with all HEP given and is able to maintain current level of funciton independently    Time  6    Period  Weeks    Status  New    Target Date  06/19/17            Plan - 06/11/17 1614    Clinical Impression Statement  Patient is improving.  Lifts apprx.  30 lbs at work now.  She has changed to working at [employer] in [employer]  She has most of her pain in early AM or after work, pain fluctuates.  Feels she is cautious about lifting and twisting. Trial of mechanical traction since she had relief with manual last visit.     PT  Treatment/Interventions  ADLs/Self Care Home Management;Cryotherapy;Electrical Stimulation;Iontophoresis 4mg /ml Dexamethasone;Moist Heat;Ultrasound;Neuromuscular re-education;Manual techniques;Therapeutic exercise;Therapeutic activities;Dry needling;Taping    PT Next Visit Plan  consider more  DN,  manual for low back, stretching of hip flexors/ hamstrings, core strength, modalities for pain PRN,  lifting practice when ready    PT Home Exercise Plan  lower trunk rotation, hamstring stretching, quadruped childs pose, posterior pelvic tilt. Sink stretch as altenative to childs pose     Consulted and Agree with Plan of Care  Patient       Patient will benefit from skilled therapeutic intervention in order to improve the following deficits and impairments:  Pain, Improper body mechanics, Postural dysfunction, Hypomobility, Increased fascial restricitons, Increased muscle spasms, Decreased activity tolerance, Decreased endurance, Decreased range of motion, Obesity  Visit Diagnosis: Chronic bilateral low back pain, with sciatica presence unspecified  Muscle spasm of back     Problem List Patient Active Problem List   Diagnosis Date Noted  . Prediabetes 01/27/2017  . ASCUS with positive high risk HPV cervical 11/10/2016  . Diabetes mellitus screening 10/07/2016  . Seasonal allergic rhinitis due to pollen 10/07/2016  . Lateral epicondylitis of both elbows 10/07/2016  . Bilateral carpal tunnel syndrome 10/07/2016  . Left breast lump 10/07/2016  . Secondary amenorrhea 10/07/2016  . HTN (hypertension) 06/24/2016    Cheryl Manning 06/11/2017, 4:18 PM  [facility] [address] [place], [place], [address] Phone: [phone]   Fax:  [phone]  Name: Cheryl Manning MRN: [id number] Date of Birth: 02-13-1981  Karie Mainland, PT 06/11/17 4:18 PM Phone: [phone] Fax: [phone]

## 2017-06-15 ENCOUNTER — Encounter: Payer: Self-pay | Admitting: Physical Therapy

## 2017-06-15 ENCOUNTER — Ambulatory Visit: Payer: BLUE CROSS/BLUE SHIELD | Admitting: Physical Therapy

## 2017-06-15 DIAGNOSIS — M62830 Muscle spasm of back: Secondary | ICD-10-CM

## 2017-06-15 DIAGNOSIS — G8929 Other chronic pain: Secondary | ICD-10-CM

## 2017-06-15 DIAGNOSIS — M545 Low back pain: Secondary | ICD-10-CM | POA: Diagnosis not present

## 2017-06-15 NOTE — Patient Instructions (Addendum)
HIP: Flexors - Supine    Lie on edge of surface. Place leg off the surface, allow knee to bend. Bring other knee toward chest. Hold _30__ seconds. _2__ reps per set, __1-2_ sets per day, __7_ days per week Rest lowered foot on stool. Make sure you breath correctly and NO ARCHING of back,   Copyright  VHI. All rights reserved.  Cheryl LahLawrie Kenlynn Manning, PT Certified Exercise Expert for the Aging Adult  06/15/17 3:44 PM Phone: [phone] Fax: [phone]

## 2017-06-15 NOTE — Therapy (Signed)
[facility] [address] [place], [place], [address] Phone: [phone]   Fax:  [phone]  Physical Therapy Treatment  Patient Details  Name: Cheryl Manning MRN: [id number] Date of Birth: [birth year] Referring Provider: Anders Simmonds, PA-C   Encounter Date: 06/15/2017  PT End of Session - 06/15/17 1807    Visit Number  6    Number of Visits  13    Date for PT Re-Evaluation  06/19/17    PT Start Time  1500    PT Stop Time  1554    PT Time Calculation (min)  54 min    Activity Tolerance  Patient tolerated treatment well    Behavior During Therapy  [facility] for tasks assessed/performed       Past Medical History:  Diagnosis Date  . HTN (hypertension) 06/24/2016  . Prediabetes 01/27/2017    History reviewed. No pertinent surgical history.  There were no vitals filed for this visit.  Subjective Assessment - 06/15/17 1503    Subjective  I do my exercises in the morning.     Limitations  Sitting;Other (comment)    How long can you sit comfortably?  30 minutes    How long can you stand comfortably?  8 hour work day    How long can you walk comfortably?  8 hour work day    Patient Stated Goals  to decrease pain, improve motion and sitting tolerance.     Currently in Pain?  Yes    Pain Score  2     Pain Location  Back    Pain Orientation  Lower    Pain Descriptors / Indicators  Aching    Pain Type  Acute pain    Pain Onset  More than a month ago    Pain Frequency  Intermittent                      [facility] Adult PT Treatment/Exercise - 06/15/17 1506      Lumbar Exercises: Stretches   Active Hamstring Stretch  30 seconds;2 reps right and left each    Single Knee to Chest Stretch  2 reps;30 seconds    Lower Trunk Rotation  3 reps;30 seconds bil    Hip Flexor Stretch  30 seconds;2 reps bil VC for abd setting and no arching of back      Lumbar Exercises: Supine   Pelvic Tilt  10 reps;5 seconds in  decompression position.      Moist Heat Therapy   Number Minutes Moist Heat  15 Minutes    Moist Heat Location  Lumbar Spine      Manual Therapy   Manual Therapy  Soft tissue mobilization    Joint Mobilization  L -4 lateral mobs bilaterally ,gentle PA glides grade 2/3 T-10 to L-5    Soft tissue mobilization  I STYM bil paraspinal and with soft tissue       Trigger Point Dry Needling - 06/15/17 1522    Consent Given?  Yes    Education Handout Provided  -- verbally with interpreter    Muscles Treated Upper Body  Longissimus    Longissimus Response  Twitch response elicited;Palpable increased muscle length T-10 to L-5 marked twitdh L3 left.           PT Education - 06/15/17 1530    Education provided  Yes    Education Details  Pt added hip flexor with abdominal setting to HEP, reviewed HEP with interpreter  Person(s) Educated  Patient    Methods  Explanation;Demonstration;Tactile cues;Verbal cues    Comprehension  Verbalized understanding;Returned demonstration       PT Short Term Goals - 06/11/17 1600      PT SHORT TERM GOAL #1   Title  Pt will be I with inital HEP      PT SHORT TERM GOAL #2   Title  pt will demo proper posture with sitting/ standing and transfers as well as appropriate lifting mechanics to prevent and reduce low back pain     Status  On-going      PT SHORT TERM GOAL #3   Title  pt to reduce back spasm to reduce pain to </= 5/10 and promote trunk mobility and therapuetic progression     Status  On-going        PT Long Term Goals - 05/08/17 1203      PT LONG TERM GOAL #1   Title  pt to increase trunk flexion to >/= 60 degrees and extension/ bil side bending by >/= 8 degrees for functional mobility     Time  6    Period  Weeks    Status  New    Target Date  06/19/17      PT LONG TERM GOAL #2   Title  pt to be able to sit/ stand and walk >/= 45 min with </= 2/10 pain for functional endurance required for work related actiivties and ADLs     Time  6    Period  Weeks    Status  New    Target Date  06/19/17      PT LONG TERM GOAL #3   Title  Increase FOTO score to </= 39% limited to demo improvement in function    Time  6    Period  Weeks    Status  New    Target Date  06/19/17      PT LONG TERM GOAL #4   Title  pt to be I with all HEP given and is able to maintain current level of funciton independently    Time  6    Period  Weeks    Status  New    Target Date  06/19/17            Plan - 06/15/17 1808    Clinical Impression Statement  Pt is improving, Pt is working 8 hour shift at [employer].  Pt does her exercises at 2;30 am when she rises for work.  She reports a 2/10 today in low back.  Pt requested TPDN and seemed benefit after RX. Pt was closely monitored throughout session and had increased tissue extensibility after RX .   Pt reports decreased stiffness after Session through interpreter.  Pt benefits from skilled PT to reinforce core strength.  Pt is stiffness dominant and will benefit from completing all sessions.      Rehab Potential  Good    PT Frequency  2x / week    PT Duration  6 weeks    PT Treatment/Interventions  ADLs/Self Care Home Management;Cryotherapy;Electrical Stimulation;Iontophoresis 4mg /ml Dexamethasone;Moist Heat;Ultrasound;Neuromuscular re-education;Manual techniques;Therapeutic exercise;Therapeutic activities;Dry needling;Taping    PT Next Visit Plan  consider more  DN,  manual for low back, stretching of hip flexors/ hamstrings, core strength, modalities for pain PRN,  lifting practice when ready    PT Home Exercise Plan  lower trunk rotation, hamstring stretching, quadruped childs pose, posterior pelvic tilt. Sink stretch as altenative to childs  pose , hip flexor stretch    Consulted and Agree with Plan of Care  Patient;Other (Comment) interpreter       Patient will benefit from skilled therapeutic intervention in order to improve the following deficits and impairments:  Pain, Improper  body mechanics, Postural dysfunction, Hypomobility, Increased fascial restricitons, Increased muscle spasms, Decreased activity tolerance, Decreased endurance, Decreased range of motion, Obesity  Visit Diagnosis: Chronic bilateral low back pain, with sciatica presence unspecified  Muscle spasm of back     Problem List Patient Active Problem List   Diagnosis Date Noted  . Prediabetes 01/27/2017  . ASCUS with positive high risk HPV cervical 11/10/2016  . Diabetes mellitus screening 10/07/2016  . Seasonal allergic rhinitis due to pollen 10/07/2016  . Lateral epicondylitis of both elbows 10/07/2016  . Bilateral carpal tunnel syndrome 10/07/2016  . Left breast lump 10/07/2016  . Secondary amenorrhea 10/07/2016  . HTN (hypertension) 06/24/2016   Garen LahLawrie Beardsley, PT Certified Exercise Expert for the Aging Adult  06/15/17 6:17 PM Phone: [phone] Fax: [phone]  [facility] [address] [place], [place], [address] Phone: [phone]   Fax:  [phone]  Name: Cheryl Manning MRN: [id number] Date of Birth: Sep 04, 1980

## 2017-06-17 ENCOUNTER — Ambulatory Visit: Payer: BLUE CROSS/BLUE SHIELD | Admitting: Physical Therapy

## 2017-06-22 ENCOUNTER — Telehealth: Payer: Self-pay | Admitting: Family Medicine

## 2017-06-22 DIAGNOSIS — I10 Essential (primary) hypertension: Secondary | ICD-10-CM

## 2017-06-22 MED ORDER — LOSARTAN POTASSIUM-HCTZ 100-12.5 MG PO TABS: 1.0000 | ORAL_TABLET | Freq: Every day | ORAL | 0 refills | Status: DC

## 2017-06-22 MED FILL — FLUTICASONE PROP 50 MCG SPR: 50 | 30 days supply | Qty: 16 | Fill #3

## 2017-06-22 MED FILL — LOSARTAN-HCTZ 100-12.5 MG T: 100-12.5 | 30 days supply | Qty: 30 | Fill #0

## 2017-06-22 MED FILL — metFORMIN HCL 500 MG TABS: 500 | 30 days supply | Qty: 60 | Fill #3

## 2017-06-22 NOTE — Telephone Encounter (Signed)
refilled 

## 2017-06-22 NOTE — Telephone Encounter (Signed)
Pt. Called requesting a refill on losartan-hydrochlorothiazide (HYZAAR) 100-12.5 MG tablet  Pt. Uses [facility] pharmacy. Please f/u

## 2017-06-30 MED FILL — GABAPENTIN 300 MG CAPSULE: 300 | 30 days supply | Qty: 60 | Fill #1

## 2017-07-21 ENCOUNTER — Ambulatory Visit: Payer: BLUE CROSS/BLUE SHIELD | Admitting: Physical Therapy

## 2017-07-28 ENCOUNTER — Encounter: Payer: Self-pay | Admitting: Physical Therapy

## 2017-07-28 ENCOUNTER — Ambulatory Visit: Payer: BLUE CROSS/BLUE SHIELD | Attending: Physician Assistant | Admitting: Physical Therapy

## 2017-07-28 DIAGNOSIS — M62830 Muscle spasm of back: Secondary | ICD-10-CM | POA: Diagnosis present

## 2017-07-28 DIAGNOSIS — M545 Low back pain: Secondary | ICD-10-CM | POA: Diagnosis not present

## 2017-07-28 DIAGNOSIS — G8929 Other chronic pain: Secondary | ICD-10-CM

## 2017-07-28 MED FILL — LOSARTAN-HCTZ 100-12.5 MG T: 100-12.5 | 30 days supply | Qty: 30 | Fill #1

## 2017-07-28 NOTE — Therapy (Signed)
[facility] [address] [place], [place], [address] Phone: [phone]   Fax:  [phone]  Physical Therapy Treatment / Re-certification  Patient Details  Name: Cheryl Manning MRN: [id number] Date of Birth: 11/20/1980 Referring Provider: Anders Simmonds, PA-C   Encounter Date: 07/28/2017  PT End of Session - 07/28/17 1503    Visit Number  7    Number of Visits  13    Date for PT Re-Evaluation  08/25/17    PT Start Time  1501    PT Stop Time  1550    PT Time Calculation (min)  49 min    Activity Tolerance  Patient tolerated treatment well    Behavior During Therapy  [facility] for tasks assessed/performed       Past Medical History:  Diagnosis Date  . HTN (hypertension) 06/24/2016  . Prediabetes 01/27/2017    History reviewed. No pertinent surgical history.  There were no vitals filed for this visit.  Subjective Assessment - 07/28/17 1507    Subjective  "I haven't been able to make it in due to work. The back has been fine, but occasionally i'll get a big cramp in the L glute which occures with walking"    Currently in Pain?  Yes    Pain Score  4     Pain Radiating Towards  referral down the [facility]    Pain Onset  More than a month ago    Pain Frequency  Intermittent    Aggravating Factors   walking/ standing , squating     Pain Relieving Factors  stretching         [facility] PT Assessment - 07/28/17 1531      AROM   Overall AROM   Within functional limits for tasks performed      Strength   Strength Assessment Site  Hip    Right/Left Hip  Left    Left Hip ABduction  4-/5 pain during testing      Palpation   Palpation comment  TTP at the glute med and piriformis                   [facility] Adult PT Treatment/Exercise - 07/28/17 1521      Lumbar Exercises: Stretches   Piriformis Stretch  2 reps;30 seconds;Left      Lumbar Exercises: Supine   Other Supine Lumbar Exercises  hip ER 2 x 10       Knee/Hip Exercises: Sidelying   Hip ABduction  Left;2 sets;15 reps    Other Sidelying Knee/Hip Exercises  reverse clam shells 2 x 10       Moist Heat Therapy   Number Minutes Moist Heat  10 Minutes    Moist Heat Location  Hip in L sidelying      Manual Therapy   Manual Therapy  Taping    Manual therapy comments  skilled palpation and monitoring pt through DN treatment    Soft tissue mobilization  IASTM over the piriformis     Kinesiotex  Inhibit Muscle      Kinesiotix   Inhibit Muscle   Star over piriformis       Trigger Point Dry Needling - 07/28/17 1520    Consent Given?  Yes    Education Handout Provided  No given previously    Muscles Treated Lower Body  Piriformis    Piriformis Response  Twitch response elicited;Palpable increased muscle length  PT Education - 07/28/17 1547    Education provided  Yes    Education Details  reviewed previously provided HEP    Person(s) Educated  Patient    Methods  Explanation;Verbal cues    Comprehension  Verbalized understanding;Verbal cues required       PT Short Term Goals - 07/28/17 1542      PT SHORT TERM GOAL #1   Title  Pt will be I with inital HEP    Time  3    Period  Weeks    Status  Achieved      PT SHORT TERM GOAL #2   Title  pt will demo proper posture with sitting/ standing and transfers as well as appropriate lifting mechanics to prevent and reduce low back pain     Time  3    Period  Weeks    Status  On-going      PT SHORT TERM GOAL #3   Title  pt to reduce back spasm to reduce pain to </= 5/10 and promote trunk mobility and therapuetic progression     Time  3    Period  Weeks    Status  Achieved        PT Long Term Goals - 07/28/17 1543      PT LONG TERM GOAL #1   Title  pt to increase trunk flexion to >/= 60 degrees and extension/ bil side bending by >/= 8 degrees for functional mobility     Time  6    Period  Weeks    Status  Achieved      PT LONG TERM GOAL #2   Title  pt to be  able to sit/ stand and walk >/= 45 min with </= 2/10 pain for functional endurance required for work related actiivties and ADLs    Time  6    Period  Weeks    Status  On-going    Target Date  08/25/17      PT LONG TERM GOAL #3   Title  Increase FOTO score to </= 39% limited to demo improvement in function    Time  6    Period  Weeks    Status  On-going    Target Date  08/25/17      PT LONG TERM GOAL #4   Title  pt to be I with all HEP given and is able to maintain current level of funciton independently    Time  6    Period  Weeks    Status  On-going    Target Date  08/25/17            Plan - 07/28/17 1544    Clinical Impression Statement  pt reports she was unable to come back to PT due to work. She reports improvement inher back but reports increased pain/ tightness with cramping in the L lateral hip. TPDN was performed on the L lateral hip followed with IASTM and taping techniques. She performed exercises reported soreness with sidelying hip abduction. utilized MHP end of session. She would benefit from physical therapy to reduce L hip pain improve walking/ standing endurnace and maximize her function.      PT Frequency  1x / week    PT Duration  4 weeks    PT Treatment/Interventions  ADLs/Self Care Home Management;Cryotherapy;Electrical Stimulation;Iontophoresis 4mg /ml Dexamethasone;Moist Heat;Ultrasound;Neuromuscular re-education;Manual techniques;Therapeutic exercise;Therapeutic activities;Dry needling;Taping    PT Next Visit Plan  Dn for hip, MTPR , core/ hip  strength,  modalities for pain PRN,  lifting practice when ready    PT Home Exercise Plan  lower trunk rotation, hamstring stretching, quadruped childs pose, posterior pelvic tilt. Sink stretch as altenative to childs pose , hip flexor stretch    Consulted and Agree with Plan of Care  Patient       Patient will benefit from skilled therapeutic intervention in order to improve the following deficits and impairments:   Pain, Improper body mechanics, Postural dysfunction, Hypomobility, Increased fascial restricitons, Increased muscle spasms, Decreased activity tolerance, Decreased endurance, Decreased range of motion, Obesity  Visit Diagnosis: Chronic bilateral low back pain, with sciatica presence unspecified  Muscle spasm of back     Problem List Patient Active Problem List   Diagnosis Date Noted  . Prediabetes 01/27/2017  . ASCUS with positive high risk HPV cervical 11/10/2016  . Diabetes mellitus screening 10/07/2016  . Seasonal allergic rhinitis due to pollen 10/07/2016  . Lateral epicondylitis of both elbows 10/07/2016  . Bilateral carpal tunnel syndrome 10/07/2016  . Left breast lump 10/07/2016  . Secondary amenorrhea 10/07/2016  . HTN (hypertension) 06/24/2016    Lulu RidingKristoffer Rochell Puett PT, DPT, LAT, ATC  07/28/17  3:56 PM      [facility] [address] [place], [place], [address] Phone: [phone]   Fax:  [phone]  Name: Cheryl Manning MRN: [id number] Date of Birth: 1980/09/26

## 2017-07-29 MED FILL — metFORMIN HCL 500 MG TABS: 500 | 30 days supply | Qty: 60 | Fill #4

## 2017-07-29 MED FILL — GABAPENTIN 300 MG CAPSULE: 300 | 30 days supply | Qty: 60 | Fill #2

## 2017-08-11 ENCOUNTER — Ambulatory Visit: Payer: BLUE CROSS/BLUE SHIELD | Admitting: Family Medicine

## 2017-08-14 ENCOUNTER — Encounter

## 2017-08-17 ENCOUNTER — Encounter: Payer: Self-pay | Admitting: Physical Therapy

## 2017-08-17 ENCOUNTER — Ambulatory Visit: Payer: BLUE CROSS/BLUE SHIELD | Admitting: Physical Therapy

## 2017-08-17 DIAGNOSIS — M62830 Muscle spasm of back: Secondary | ICD-10-CM

## 2017-08-17 DIAGNOSIS — G8929 Other chronic pain: Secondary | ICD-10-CM

## 2017-08-17 DIAGNOSIS — M545 Low back pain: Principal | ICD-10-CM

## 2017-08-17 NOTE — Therapy (Signed)
[facility] [place], [place], [address] Phone: [phone]   Fax:  [phone]  Physical Therapy Treatment / Discharge Summary  Patient Details  Name: Cheryl Manning MRN: [id number] Date of Birth: Apr 22, 1980 Referring Provider: Argentina Donovan, PA-C   Encounter Date: 08/17/2017  PT End of Session - 08/17/17 1527    Visit Number  8    Number of Visits  13    Date for PT Re-Evaluation  08/25/17    PT Start Time  1501    PT Stop Time  1536    PT Time Calculation (min)  35 min    Activity Tolerance  Patient limited by pain    Behavior During Therapy  [facility] for tasks assessed/performed       Past Medical History:  Diagnosis Date  . HTN (hypertension) 06/24/2016  . Prediabetes 01/27/2017    History reviewed. No pertinent surgical history.  There were no vitals filed for this visit.  Subjective Assessment - 08/17/17 1502    Subjective  "I am having more pain in the back and its 10/10. the pain goes down my L leg to the back of my thigh. I had one day where I was unable to move for over 5 hours"    Currently in Pain?  Yes    Pain Score  10-Worst pain ever    Pain Orientation  Lower    Pain Descriptors / Indicators  Spasm;Sharp    Pain Type  Acute pain    Pain Onset  More than a month ago    Pain Frequency  Constant    Aggravating Factors   any activity                       [facility] Adult PT Treatment/Exercise - 08/17/17 0001      Modalities   Modalities  Electrical Stimulation      Moist Heat Therapy   Number Minutes Moist Heat  10 Minutes    Moist Heat Location  Lumbar Spine in prone      Electrical Stimulation   Electrical Stimulation Location  Low back     Electrical Stimulation Action  IFC    Electrical Stimulation Parameters  L25 x 10 min with 100% scan    Electrical Stimulation Goals  Pain             PT Education - 08/17/17 1526    Education provided  Yes    Education Details  if she has significant pain then she needs to go to the ED or urgent care. reviewed anatomy of the spine and since no progress has been made that she needs to go back to her MD for re-assessment and further evaluation.     Person(s) Educated  Patient with significant other    Methods  Explanation;Verbal cues    Comprehension  Verbalized understanding;Verbal cues required       PT Short Term Goals - 08/17/17 1530      PT SHORT TERM GOAL #2   Title  pt will demo proper posture with sitting/ standing and transfers as well as appropriate lifting mechanics to prevent and reduce low back pain     Time  3    Period  Weeks    Status  Not Met      PT SHORT TERM GOAL #3   Title  pt to reduce back spasm to reduce pain to </= 5/10 and promote trunk mobility and  therapuetic progression     Time  3    Period  Weeks    Status  Not Met        PT Long Term Goals - 08/17/17 1530      PT LONG TERM GOAL #1   Title  pt to increase trunk flexion to >/= 60 degrees and extension/ bil side bending by >/= 8 degrees for functional mobility     Time  6    Period  Weeks    Status  Unable to assess      PT LONG TERM GOAL #2   Title  pt to be able to sit/ stand and walk >/= 45 min with </= 2/10 pain for functional endurance required for work related actiivties and ADLs    Time  6    Period  Weeks    Status  On-going      PT LONG TERM GOAL #3   Title  Increase FOTO score to </= 39% limited to demo improvement in function    Time  6    Period  Weeks    Status  Not Met      PT LONG TERM GOAL #4   Title  pt to be I with all HEP given and is able to maintain current level of funciton independently    Time  6    Period  Weeks    Status  Not Met            Plan - 08/17/17 1528    Clinical Impression Statement  pt continues to report pain at 10/10 in the low back that is better for a minimal amount of time with extension biased treatment. She is vague in regard to position that  causes pain especially since she gets some relief with repeated extension. due to limited progress and continued reprot of 10/10 pain plan to discharge pt back to her MD for further assessment.      PT Next Visit Plan  D/C    PT Home Exercise Plan  lower trunk rotation, hamstring stretching, quadruped childs pose, posterior pelvic tilt. Sink stretch as altenative to childs pose , hip flexor stretch    Consulted and Agree with Plan of Care  Patient       Patient will benefit from skilled therapeutic intervention in order to improve the following deficits and impairments:  Pain, Improper body mechanics, Postural dysfunction, Hypomobility, Increased fascial restricitons, Increased muscle spasms, Decreased activity tolerance, Decreased endurance, Decreased range of motion, Obesity  Visit Diagnosis: Chronic bilateral low back pain, with sciatica presence unspecified  Muscle spasm of back     Problem List Patient Active Problem List   Diagnosis Date Noted  . Prediabetes 01/27/2017  . ASCUS with positive high risk HPV cervical 11/10/2016  . Diabetes mellitus screening 10/07/2016  . Seasonal allergic rhinitis due to pollen 10/07/2016  . Lateral epicondylitis of both elbows 10/07/2016  . Bilateral carpal tunnel syndrome 10/07/2016  . Left breast lump 10/07/2016  . Secondary amenorrhea 10/07/2016  . HTN (hypertension) 06/24/2016    Starr [place] 08/17/2017, 3:32 PM  [facility] [address] [place], [place], [address] Phone: [phone]   Fax:  [phone]  Name: Cheryl Manning MRN: [id number] Date of Birth: 1980/11/10        PHYSICAL THERAPY DISCHARGE SUMMARY  Visits from Start of Care: 8  Current functional level related to goals / functional outcomes: See goals   Remaining deficits: Continued pain rated  at 10/10 in the low back, limited mobility. Progresses slightly with repeated extension in prone but  reports pain returns.    Education / Equipment: HEP, posture, anatomy  Plan: Patient agrees to discharge.  Patient goals were not met. Patient is being discharged due to lack of progress.  ?????         Kristoffer Leamon PT, DPT, LAT, ATC  08/17/17  3:34 PM

## 2017-08-24 ENCOUNTER — Ambulatory Visit: Payer: BLUE CROSS/BLUE SHIELD | Admitting: Physical Therapy

## 2017-08-26 MED FILL — LOSARTAN-HCTZ 100-12.5 MG T: 100-12.5 | 30 days supply | Qty: 30 | Fill #2

## 2017-08-31 ENCOUNTER — Ambulatory Visit: Payer: BLUE CROSS/BLUE SHIELD | Admitting: Physical Therapy

## 2017-09-07 MED FILL — metFORMIN HCL 500 MG TABS: 500 | 30 days supply | Qty: 60 | Fill #5

## 2017-09-07 MED FILL — GABAPENTIN 300 MG CAPSULE: 300 | 30 days supply | Qty: 60 | Fill #3

## 2017-09-09 ENCOUNTER — Ambulatory Visit: Payer: BLUE CROSS/BLUE SHIELD | Attending: Family Medicine | Admitting: Physician Assistant

## 2017-09-09 VITALS — BP 139/96 | HR 91 | Temp 98.7°F | Resp 16 | Wt 279.4 lb

## 2017-09-09 DIAGNOSIS — I10 Essential (primary) hypertension: Secondary | ICD-10-CM | POA: Insufficient documentation

## 2017-09-09 DIAGNOSIS — Z7984 Long term (current) use of oral hypoglycemic drugs: Secondary | ICD-10-CM | POA: Insufficient documentation

## 2017-09-09 DIAGNOSIS — M549 Dorsalgia, unspecified: Secondary | ICD-10-CM | POA: Diagnosis present

## 2017-09-09 DIAGNOSIS — R7303 Prediabetes: Secondary | ICD-10-CM | POA: Insufficient documentation

## 2017-09-09 DIAGNOSIS — Z79899 Other long term (current) drug therapy: Secondary | ICD-10-CM | POA: Diagnosis not present

## 2017-09-09 DIAGNOSIS — M5136 Other intervertebral disc degeneration, lumbar region: Secondary | ICD-10-CM | POA: Diagnosis not present

## 2017-09-09 LAB — GLUCOSE, POCT (MANUAL RESULT ENTRY): POC Glucose: 114 mg/dl — AB (ref 70–99)

## 2017-09-09 MED ORDER — METHOCARBAMOL 500 MG PO TABS: 1000.0000 mg | ORAL_TABLET | Freq: Three times a day (TID) | ORAL | 1 refills | Status: DC | PRN

## 2017-09-09 MED ORDER — MELOXICAM 15 MG PO TABS: 15.0000 mg | ORAL_TABLET | Freq: Every day | ORAL | 5 refills | Status: DC

## 2017-09-09 MED FILL — METHOCARBAMOL 500 MG TABLET: 500 | 15 days supply | Qty: 90 | Fill #0

## 2017-09-09 MED FILL — MELOXICAM 15 MG TABLET: 15 | 30 days supply | Qty: 30 | Fill #0

## 2017-09-09 NOTE — Progress Notes (Signed)
Patient ID: Cheryl Manning, female   DOB: 1980/10/12, 37 y.o.   MRN: [id number]      Cheryl Manning, is a 37 y.o. female  [id number]:[id number]  ZHY:[id number]  DOB - Aug 07, 1980  Subjective:  Chief Complaint and HPI: Cheryl Manning is a 37 y.o. female here today [employer] interpreters "Cheryl Manning." Back pain seems to be increasing over 7-8 months.  Xrays 01/2017 showed degenerative changes at L4-L5.  She underwent PT and has completed that.  Pain does not seem any better.  Still has significant LBP most days.  Takes ibuprofen at times which helps some. Also c/o B hand and B knee pain daily.  No tick bites.  No f/c.  No weakness/paresthesias.     ROS:   Constitutional:  No f/c, No night sweats, No unexplained weight loss. EENT:  No vision changes, No blurry vision, No hearing changes. No mouth, throat, or ear problems.  Respiratory: No cough, No SOB Cardiac: No CP, no palpitations GI:  No abd pain, No N/V/D. GU: No Urinary s/sx Musculoskeletal: pain as above Neuro: No headache, no dizziness, no motor weakness.  Skin: No rash Endocrine:  No polydipsia. No polyuria.  Psych: Denies SI/HI  No problems updated.  ALLERGIES: Allergies  Allergen Reactions  . Cinnamon Anaphylaxis    PAST MEDICAL HISTORY: Past Medical History:  Diagnosis Date  . HTN (hypertension) 06/24/2016  . Prediabetes 01/27/2017    MEDICATIONS AT HOME: Prior to Admission medications   Medication Sig Start Date End Date Taking? Authorizing Provider  cetirizine (ZYRTEC) 10 MG tablet Take 1 tablet (10 mg total) by mouth daily. 10/07/16   Marcine Matar, MD  diclofenac sodium (VOLTAREN) 1 % GEL Apply 2 g topically 4 (four) times daily. Patient not taking: Reported on 07/28/2017 10/07/16   Marcine Matar, MD  fluticasone [facility]) 50 MCG/ACT nasal spray Place 2 sprays into both nostrils daily. 10/07/16   Marcine Matar, MD  gabapentin (NEURONTIN) 300 MG capsule Take 2 capsules (600 mg total) by mouth  at bedtime. 05/11/17   Marcine Matar, MD  losartan-hydrochlorothiazide (HYZAAR) 100-12.5 MG tablet Take 1 tablet by mouth daily. 06/22/17   Marcine Matar, MD  medroxyPROGESTERone (PROVERA) 10 MG tablet Take one tab daily for 7 days the third week of every month to induce menstrual periods Patient not taking: Reported on 07/28/2017 10/07/16   Marcine Matar, MD  meloxicam (MOBIC) 15 MG tablet Take 1 tablet (15 mg total) by mouth daily. Prn pain 09/09/17   Anders Simmonds, PA-C  metFORMIN (GLUCOPHAGE) 500 MG tablet Take 2 tablets (1,000 mg total) by mouth daily with breakfast. Dose increase 01/08/17   Marcine Matar, MD  methocarbamol (ROBAXIN) 500 MG tablet Take 2 tablets (1,000 mg total) by mouth every 8 (eight) hours as needed for muscle spasms. 09/09/17   Anders Simmonds, PA-C  Polyethyl Glycol-Propyl Glycol (SYSTANE) 0.4-0.3 % GEL ophthalmic gel Place 1 application into both eyes at bedtime as needed. Patient not taking: Reported on 07/28/2017 04/08/17   Belinda Fisher, PA-C     Objective:  EXAM:   Vitals:   09/09/17 1351  BP: (!) 139/96  Pulse: 91  Resp: 16  Temp: 98.7 F (37.1 C)  TempSrc: Oral  SpO2: 97%  Weight: 279 lb 6.4 oz (126.7 kg)    General appearance : A&OX3. NAD. Non-toxic-appearing, morbidly obese HEENT: Atraumatic and Normocephalic.  PERRLA. EOM intact.   Neck: supple, no JVD. No cervical lymphadenopathy. No thyromegaly Chest/Lungs:  Breathing-non-labored, Good  air entry bilaterally, breath sounds normal without rales, rhonchi, or wheezing  CVS: S1 S2 regular, no murmurs, gallops, rubs  Back:  TTP in lumbosacral area.  ROM ~80% normal; partially limited by obesity.  Neg SLR.  DTR=B.   Extremities: Bilateral Lower Ext shows no edema, both legs are warm to touch with = pulse throughout Neurology:  CN II-XII grossly intact, Non focal.   Psych:  TP linear. J/I WNL. Normal speech. Appropriate eye contact and affect.  Skin:  No Rash  Data Review Lab Results    Component Value Date   HGBA1C 6.1 05/11/2017   HGBA1C 6.1 10/07/2016     Assessment & Plan   1. Degenerative disc disease, lumbar - Vitamin D, 25-hydroxy - Sedimentation Rate - meloxicam (MOBIC) 15 MG tablet; Take 1 tablet (15 mg total) by mouth daily. Prn pain  Dispense: 30 tablet; Refill: 5 - methocarbamol (ROBAXIN) 500 MG tablet; Take 2 tablets (1,000 mg total) by mouth every 8 (eight) hours as needed for muscle spasms.  Dispense: 90 tablet; Refill: 1 - Ambulatory referral to Orthopedic Surgery  2. Prediabetes Blood sugar is ok today - Glucose (CBG)  3. Essential hypertension Takes meds.  Weight loss and DASH diet recommended.   - Comprehensive metabolic panel We have discussed target BP range and blood pressure goal. I have advised patient to check BP regularly and to call us back or report to clinic if the numbers are consistently higher than 140/90. We discussed the importance of compliance with medical therapy and DASH diet recommended, consequences of uncontrolled hypertension discussed.     Patient have been counseled extensively about nutrition and exercise  Return in about 2 months (around 11/09/2017) for Dr Alvis Lemmings BP and blood sugar management.  The patient was given clear instructions to go to ER or return to medical center if symptoms don't improve, worsen or new problems develop. The patient verbalized understanding. The patient was told to call to get lab results if they haven't heard anything in the next week.     Georgian Co, PA-C [facility] [place], [place] [phone]   09/09/2017, 2:22 PM

## 2017-09-10 LAB — SEDIMENTATION RATE: Sed Rate: 37 mm/hr — ABNORMAL HIGH (ref 0–32)

## 2017-09-10 LAB — COMPREHENSIVE METABOLIC PANEL
A/G RATIO: 1.6 (ref 1.2–2.2)
ALT: 45 IU/L — AB (ref 0–32)
AST: 39 IU/L (ref 0–40)
Albumin: 4.7 g/dL (ref 3.5–5.5)
Alkaline Phosphatase: 80 IU/L (ref 39–117)
BUN/Creatinine Ratio: 19 (ref 9–23)
BUN: 13 mg/dL (ref 6–20)
Bilirubin Total: 0.3 mg/dL (ref 0.0–1.2)
CALCIUM: 9.7 mg/dL (ref 8.7–10.2)
CO2: 24 mmol/L (ref 20–29)
CREATININE: 0.7 mg/dL (ref 0.57–1.00)
Chloride: 102 mmol/L (ref 96–106)
GFR calc Af Amer: 129 mL/min/{1.73_m2} (ref >59)
GFR, EST NON AFRICAN AMERICAN: 112 mL/min/{1.73_m2} (ref >59)
GLOBULIN, TOTAL: 2.9 g/dL (ref 1.5–4.5)
Glucose: 94 mg/dL (ref 65–99)
POTASSIUM: 4.2 mmol/L (ref 3.5–5.2)
SODIUM: 142 mmol/L (ref 134–144)
Total Protein: 7.6 g/dL (ref 6.0–8.5)

## 2017-09-10 LAB — VITAMIN D 25 HYDROXY (VIT D DEFICIENCY, FRACTURES): VIT D 25 HYDROXY: 27 ng/mL — AB (ref 30.0–100.0)

## 2017-09-11 ENCOUNTER — Telehealth: Payer: Self-pay

## 2017-09-11 MED FILL — FLUTICASONE PROP 50 MCG SPR: 50 | 30 days supply | Qty: 16 | Fill #4

## 2017-09-11 NOTE — Telephone Encounter (Signed)
CMA call regarding lab results   Patient Verify DOB   Patient was aware and understood  

## 2017-09-11 NOTE — Telephone Encounter (Signed)
-----   Message from Particia Lather, [place] sent at 09/10/2017 10:53 AM EDT -----   ----- Message ----- From: Bary Richard Sent: 09/10/2017   8:35 AM To: Particia Lather, RMA  Please call patient and let them that their vitamin D is low.  This can contribute to muscle aches, anxiety, fatigue, and depression.  She needs to take 2000 units of vitamin D daily.  We will recheck this level in 3-4 months.  Her inflammatory rate is a little high.  We will continue to monitor this as it is not at unusually high levels.  Her other labs look good-kidney function, liver function, electrolytes, and blood sugar.  Follow-up as planned.  Thanks, Georgian Co, PA-C

## 2017-09-15 ENCOUNTER — Ambulatory Visit (INDEPENDENT_AMBULATORY_CARE_PROVIDER_SITE_OTHER): Payer: BLUE CROSS/BLUE SHIELD | Admitting: Orthopaedic Surgery

## 2017-09-15 DIAGNOSIS — M7711 Lateral epicondylitis, right elbow: Secondary | ICD-10-CM

## 2017-09-15 DIAGNOSIS — M7712 Lateral epicondylitis, left elbow: Secondary | ICD-10-CM

## 2017-09-15 NOTE — Progress Notes (Signed)
Office Visit Note   Patient: Cheryl Manning           Date of Birth: 06/21/1980           MRN: [id number] Visit Date: 09/15/2017              Requested by: Hoy Register, MD [address] [place], [place] [address] PCP: Hoy Register, MD   Assessment & Plan: Visit Diagnoses:  1. Lateral epicondylitis of both elbows     Plan: Impression is bilateral lateral epicondylitis.  Today, we will reinject both sides with cortisone.  Of also instructed the patient to start wearing her tennis elbow strap.  We will also provide her with extensor tendon stretches.  We will write a work note for no lifting greater than 10lbs for the next 4 weeks.  If still having pain after that, she has been instructed to follow back up with [place].  Call with concerns or questions in the meantime.  Follow-Up Instructions: Return if symptoms worsen or fail to improve.   Orders:  Orders Placed This Encounter  Procedures  . Hand/UE Inj: bilateral elbow   No orders of the defined types were placed in this encounter.     Procedures: Hand/UE Inj: bilateral elbow for lateral epicondylitis on 09/15/2017 3:58 PM      Clinical Data: No additional findings.   Subjective: Chief Complaint  Patient presents with  . Right Elbow - Pain    HPI patient is a pleasant 37 year old female who presents to our clinic today with recurrent bilateral elbow pain.  History of bilateral lateral epicondylitis which was injected by [place] back in June 2018.  Injection significantly helped, but only lasted for about 6 months.  Her pain has returned and has started to worsen.  All the pain is to the lateral epicondyle both sides.  Pain is worse with extension of the wrist and elbow as well as gripping of the hands.  She has tried wearing a brace which is been of minimal relief.  Review of Systems as detailed in HPI.  All others reviewed and are negative.   Objective: Vital Signs: There were no vitals taken for this  visit.  Physical Exam well-developed well-nourished female no acute distress.  Alert and oriented x3.  Ortho Exam examination of both elbows reveals marked tenderness to the lateral epicondyle.  Minimal tenderness over the radial tunnel.  Decreased grip strength secondary to pain.  Increased pain with wrist extension.  Specialty Comments:  No specialty comments available.  Imaging: No new imaging   PMFS History: Patient Active Problem List   Diagnosis Date Noted  . Prediabetes 01/27/2017  . ASCUS with positive high risk HPV cervical 11/10/2016  . Diabetes mellitus screening 10/07/2016  . Seasonal allergic rhinitis due to pollen 10/07/2016  . Lateral epicondylitis of both elbows 10/07/2016  . Bilateral carpal tunnel syndrome 10/07/2016  . Left breast lump 10/07/2016  . Secondary amenorrhea 10/07/2016  . HTN (hypertension) 06/24/2016   Past Medical History:  Diagnosis Date  . HTN (hypertension) 06/24/2016  . Prediabetes 01/27/2017    Family History  Problem Relation Age of Onset  . Diabetes Father   . Hypertension Father   . Uterine cancer Sister   . Hypertension Brother   . Drug abuse Paternal Uncle   . Uterine cancer Maternal Grandmother   . Uterine cancer Paternal Grandmother   . Breast cancer Paternal Aunt     No past surgical history on file. Social History  Occupational History  . Not on file  Tobacco Use  . Smoking status: Former Games developer  . Smokeless tobacco: Never Used  Substance and Sexual Activity  . Alcohol use: Not on file  . Drug use: Not on file  . Sexual activity: Not on file

## 2017-09-18 ENCOUNTER — Other Ambulatory Visit: Payer: Self-pay | Admitting: Family Medicine

## 2017-09-18 NOTE — Progress Notes (Signed)
Patient walks into the clinic requesting a letter to return to work with no restrictions.  Previous letter she received from [facility] indicated no lifting greater than 15 pounds but this is almost resulted in her termination.  Will write letter as per patient request and I have confirmed with her that this is what she would want in the setting of her back pain and she agrees to this.

## 2017-09-23 ENCOUNTER — Other Ambulatory Visit: Payer: Self-pay

## 2017-09-23 ENCOUNTER — Telehealth: Payer: Self-pay | Admitting: Family Medicine

## 2017-09-23 DIAGNOSIS — I10 Essential (primary) hypertension: Secondary | ICD-10-CM

## 2017-09-23 MED ORDER — LOSARTAN POTASSIUM-HCTZ 100-12.5 MG PO TABS: 1.0000 | ORAL_TABLET | Freq: Every day | ORAL | 1 refills | Status: DC

## 2017-09-23 MED FILL — LOSARTAN-HCTZ 100-12.5 MG T: 100-12.5 | 30 days supply | Qty: 30 | Fill #0

## 2017-09-23 NOTE — Telephone Encounter (Signed)
Patient called requesting a refill on losartan-hydrochlorothiazide (HYZAAR) 100-12.5 MG tablet  [facility]. Please f/u

## 2017-09-23 NOTE — Telephone Encounter (Signed)
Medication has been sent to onsite pharmacy.

## 2017-09-30 ENCOUNTER — Ambulatory Visit (INDEPENDENT_AMBULATORY_CARE_PROVIDER_SITE_OTHER): Payer: BLUE CROSS/BLUE SHIELD | Admitting: Physical Medicine and Rehabilitation

## 2017-09-30 ENCOUNTER — Encounter (INDEPENDENT_AMBULATORY_CARE_PROVIDER_SITE_OTHER): Payer: Self-pay | Admitting: Physical Medicine and Rehabilitation

## 2017-09-30 VITALS — BP 145/98 | HR 79

## 2017-09-30 DIAGNOSIS — M545 Low back pain: Secondary | ICD-10-CM

## 2017-09-30 DIAGNOSIS — M25561 Pain in right knee: Secondary | ICD-10-CM | POA: Diagnosis not present

## 2017-09-30 DIAGNOSIS — G8929 Other chronic pain: Secondary | ICD-10-CM

## 2017-09-30 DIAGNOSIS — M25562 Pain in left knee: Secondary | ICD-10-CM | POA: Diagnosis not present

## 2017-09-30 NOTE — Progress Notes (Signed)
Numeric Pain Rating Scale and Functional Assessment Average Pain 10 Pain Right Now 8 My pain is constant, stabbing and aching Pain is worse with: sitting, standing and lying down Pain improves with: nothing   In the last MONTH (on 0-10 scale) has pain interfered with the following?  1. General activity like being  able to carry out your everyday physical activities such as walking, climbing stairs, carrying groceries, or moving a chair?  Rating(10)  2. Relation with others like being able to carry out your usual social activities and roles such as  activities at home, at work and in your community. Rating(10)  3. Enjoyment of life such that you have  been bothered by emotional problems such as feeling anxious, depressed or irritable?  Rating(8)

## 2017-10-05 ENCOUNTER — Ambulatory Visit (INDEPENDENT_AMBULATORY_CARE_PROVIDER_SITE_OTHER): Payer: BLUE CROSS/BLUE SHIELD | Admitting: Physical Medicine and Rehabilitation

## 2017-10-05 ENCOUNTER — Encounter (INDEPENDENT_AMBULATORY_CARE_PROVIDER_SITE_OTHER): Payer: Self-pay | Admitting: Physical Medicine and Rehabilitation

## 2017-10-05 ENCOUNTER — Ambulatory Visit (INDEPENDENT_AMBULATORY_CARE_PROVIDER_SITE_OTHER): Payer: BLUE CROSS/BLUE SHIELD

## 2017-10-05 VITALS — BP 126/81 | HR 77

## 2017-10-05 DIAGNOSIS — M5416 Radiculopathy, lumbar region: Secondary | ICD-10-CM | POA: Diagnosis not present

## 2017-10-05 MED ORDER — METHYLPREDNISOLONE ACETATE 80 MG/ML IJ SUSP
80.0000 mg | Freq: Once | INTRAMUSCULAR | Status: AC
Start: 2017-10-05 — End: 2017-10-05
  Administered 2017-10-05: 80 mg

## 2017-10-05 MED FILL — MELOXICAM 15 MG TABLET: 15 | 30 days supply | Qty: 30 | Fill #1

## 2017-10-05 NOTE — Patient Instructions (Signed)

## 2017-10-05 NOTE — Progress Notes (Signed)
 .  Numeric Pain Rating Scale and Functional Assessment Average Pain 8   In the last MONTH (on 0-10 scale) has pain interfered with the following?  1. General activity like being  able to carry out your everyday physical activities such as walking, climbing stairs, carrying groceries, or moving a chair?  Rating(5)   +Driver, -BT, -Dye Allergies.  

## 2017-10-06 NOTE — Progress Notes (Signed)
Cheryl Manning - 37 y.o. female MRN [id number]  Date of birth: 08-Sep-1980  Office Visit Note: Visit Date: 10/05/2017 PCP: Hoy Register, MD Referred by: Hoy Register, MD  Subjective: Chief Complaint  Patient presents with  . Lower Back - Pain  . Right Leg - Pain  . Left Leg - Pain   HPI: Cheryl Manning is a 37 year old female with chronic worsening low back pain and right more than left radicular leg pain who comes in today for planned L5-S1 intralaminar epidural steroid injection.  She is present with an interpreter today.  Please see our prior evaluation and management note for further details and justification.   ROS Otherwise per HPI.  Assessment & Plan: Visit Diagnoses:  1. Lumbar radiculopathy     Plan: No additional findings.   Meds & Orders:  Meds ordered this encounter  Medications  . methylPREDNISolone acetate (DEPO-MEDROL) injection 80 mg    Orders Placed This Encounter  Procedures  . XR C-ARM NO REPORT  . Epidural Steroid injection    Follow-up: Return if symptoms worsen or fail to improve.   Procedures: No procedures performed  Lumbar Epidural Steroid Injection - Interlaminar Approach with Fluoroscopic Guidance  Patient: Cheryl Manning      Date of Birth: 1981-03-07 MRN: [id number] PCP: Hoy Register, MD      Visit Date: 10/05/2017   Universal Protocol:     Consent Given By: the patient  Position: PRONE  Additional Comments: Vital signs were monitored before and after the procedure. Patient was prepped and draped in the usual sterile fashion. The correct patient, procedure, and site was verified.   Injection Procedure Details:  Procedure Site One Meds Administered:  Meds ordered this encounter  Medications  . methylPREDNISolone acetate (DEPO-MEDROL) injection 80 mg     Laterality: Right  Location/Site:  L5-S1  Needle size: 20 G  Needle type: Tuohy  Needle Placement: Paramedian epidural  Findings:    -Comments: Excellent flow of contrast into the epidural space.  Procedure Details: Using a paramedian approach from the side mentioned above, the region overlying the inferior lamina was localized under fluoroscopic visualization and the soft tissues overlying this structure were infiltrated with 4 ml. of 1% Lidocaine without Epinephrine. The Tuohy needle was inserted into the epidural space using a paramedian approach.   The epidural space was localized using loss of resistance along with lateral and bi-planar fluoroscopic views.  After negative aspirate for air, blood, and CSF, a 2 ml. volume of Isovue-250 was injected into the epidural space and the flow of contrast was observed. Radiographs were obtained for documentation purposes.    The injectate was administered into the level noted above.   Additional Comments:  The patient tolerated the procedure well Dressing: Band-Aid    Post-procedure details: Patient was observed during the procedure. Post-procedure instructions were reviewed.  Patient left the clinic in stable condition.   Clinical History: LUMBAR SPINE - 2-3 VIEW  COMPARISON:  None  FINDINGS: Osseous demineralization.  Five non-rib-bearing lumbar vertebra.  Disc space narrowing L4-L5.  Vertebral body and disc space heights otherwise maintained.  No acute fracture, subluxation, or bone destruction.  No gross evidence of spondylolysis.  SI joints preserved.  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   Electronically Signed   By: Ulyses Southward M.D.   On: 02/09/2017 15:21   She reports that she has quit smoking. She has never used smokeless tobacco.  Recent Labs    10/07/16 0927 05/11/17 1650  HGBA1C  6.1 6.1    Objective:  VS:  HT:    WT:   BMI:     BP:126/81  HR:77bpm  TEMP: ( )  RESP:  Physical Exam  Ortho Exam Imaging: Xr C-arm No Report  Result Date: 10/05/2017 Please see Notes tab for imaging  impression.   Past Medical/Family/Surgical/Social History: Medications & Allergies reviewed per EMR, new medications updated. Patient Active Problem List   Diagnosis Date Noted  . Prediabetes 01/27/2017  . ASCUS with positive high risk HPV cervical 11/10/2016  . Diabetes mellitus screening 10/07/2016  . Seasonal allergic rhinitis due to pollen 10/07/2016  . Lateral epicondylitis of both elbows 10/07/2016  . Bilateral carpal tunnel syndrome 10/07/2016  . Left breast lump 10/07/2016  . Secondary amenorrhea 10/07/2016  . HTN (hypertension) 06/24/2016   Past Medical History:  Diagnosis Date  . HTN (hypertension) 06/24/2016  . Prediabetes 01/27/2017   Family History  Problem Relation Age of Onset  . Diabetes Father   . Hypertension Father   . Uterine cancer Sister   . Hypertension Brother   . Drug abuse Paternal Uncle   . Uterine cancer Maternal Grandmother   . Uterine cancer Paternal Grandmother   . Breast cancer Paternal Aunt    History reviewed. No pertinent surgical history. Social History   Occupational History  . Not on file  Tobacco Use  . Smoking status: Former Games developermoker  . Smokeless tobacco: Never Used  Substance and Sexual Activity  . Alcohol use: Not on file  . Drug use: Not on file  . Sexual activity: Not on file

## 2017-10-06 NOTE — Procedures (Signed)
Lumbar Epidural Steroid Injection - Interlaminar Approach with Fluoroscopic Guidance  Patient: Cheryl Manning      Date of Birth: Sep 01, 1980 MRN: [id number] PCP: Hoy RegisterNewlin, Enobong, MD      Visit Date: 10/05/2017   Universal Protocol:     Consent Given By: the patient  Position: PRONE  Additional Comments: Vital signs were monitored before and after the procedure. Patient was prepped and draped in the usual sterile fashion. The correct patient, procedure, and site was verified.   Injection Procedure Details:  Procedure Site One Meds Administered:  Meds ordered this encounter  Medications  . methylPREDNISolone acetate (DEPO-MEDROL) injection 80 mg     Laterality: Right  Location/Site:  L5-S1  Needle size: 20 G  Needle type: Tuohy  Needle Placement: Paramedian epidural  Findings:   -Comments: Excellent flow of contrast into the epidural space.  Procedure Details: Using a paramedian approach from the side mentioned above, the region overlying the inferior lamina was localized under fluoroscopic visualization and the soft tissues overlying this structure were infiltrated with 4 ml. of 1% Lidocaine without Epinephrine. The Tuohy needle was inserted into the epidural space using a paramedian approach.   The epidural space was localized using loss of resistance along with lateral and bi-planar fluoroscopic views.  After negative aspirate for air, blood, and CSF, a 2 ml. volume of Isovue-250 was injected into the epidural space and the flow of contrast was observed. Radiographs were obtained for documentation purposes.    The injectate was administered into the level noted above.   Additional Comments:  The patient tolerated the procedure well Dressing: Band-Aid    Post-procedure details: Patient was observed during the procedure. Post-procedure instructions were reviewed.  Patient left the clinic in stable condition.

## 2017-10-26 MED FILL — METHOCARBAMOL 500 MG TABS: 500 | 15 days supply | Qty: 90 | Fill #1

## 2017-10-26 MED FILL — LOSARTAN-HCTZ 100-12.5 MG T: 100-12.5 | 30 days supply | Qty: 30 | Fill #1

## 2017-11-06 ENCOUNTER — Other Ambulatory Visit: Payer: Self-pay | Admitting: Internal Medicine

## 2017-11-06 DIAGNOSIS — R7303 Prediabetes: Secondary | ICD-10-CM

## 2017-11-06 DIAGNOSIS — G5601 Carpal tunnel syndrome, right upper limb: Secondary | ICD-10-CM

## 2017-11-06 MED FILL — MELOXICAM 15 MG TABLET: 15 | 30 days supply | Qty: 30 | Fill #2

## 2017-11-09 ENCOUNTER — Ambulatory Visit: Payer: BLUE CROSS/BLUE SHIELD | Admitting: Family Medicine

## 2017-11-19 ENCOUNTER — Other Ambulatory Visit: Payer: Self-pay

## 2017-11-19 DIAGNOSIS — I10 Essential (primary) hypertension: Secondary | ICD-10-CM

## 2017-11-19 MED ORDER — LOSARTAN POTASSIUM-HCTZ 100-12.5 MG PO TABS: 1.0000 | ORAL_TABLET | Freq: Every day | ORAL | 0 refills | Status: DC

## 2017-11-19 MED FILL — LOSARTAN-HCTZ 100-12.5 MG T: 100-12.5 | 30 days supply | Qty: 30 | Fill #0

## 2017-11-25 ENCOUNTER — Ambulatory Visit: Payer: BLUE CROSS/BLUE SHIELD | Attending: Family Medicine | Admitting: Physician Assistant

## 2017-11-25 VITALS — BP 130/87 | HR 80 | Temp 98.6°F | Resp 16 | Wt 278.6 lb

## 2017-11-25 DIAGNOSIS — I10 Essential (primary) hypertension: Secondary | ICD-10-CM | POA: Diagnosis not present

## 2017-11-25 DIAGNOSIS — Z79899 Other long term (current) drug therapy: Secondary | ICD-10-CM | POA: Diagnosis not present

## 2017-11-25 DIAGNOSIS — M5136 Other intervertebral disc degeneration, lumbar region: Secondary | ICD-10-CM

## 2017-11-25 DIAGNOSIS — M2550 Pain in unspecified joint: Secondary | ICD-10-CM

## 2017-11-25 DIAGNOSIS — Z760 Encounter for issue of repeat prescription: Secondary | ICD-10-CM | POA: Diagnosis present

## 2017-11-25 DIAGNOSIS — E559 Vitamin D deficiency, unspecified: Secondary | ICD-10-CM | POA: Diagnosis not present

## 2017-11-25 DIAGNOSIS — Z789 Other specified health status: Secondary | ICD-10-CM

## 2017-11-25 DIAGNOSIS — R7303 Prediabetes: Secondary | ICD-10-CM | POA: Diagnosis not present

## 2017-11-25 DIAGNOSIS — Z7984 Long term (current) use of oral hypoglycemic drugs: Secondary | ICD-10-CM | POA: Insufficient documentation

## 2017-11-25 LAB — POCT GLYCOSYLATED HEMOGLOBIN (HGB A1C): HbA1c, POC (prediabetic range): 5.7 % (ref 5.7–6.4)

## 2017-11-25 LAB — GLUCOSE, POCT (MANUAL RESULT ENTRY): POC GLUCOSE: 96 mg/dL (ref 70–99)

## 2017-11-25 MED ORDER — LOSARTAN POTASSIUM-HCTZ 100-12.5 MG PO TABS: 1.0000 | ORAL_TABLET | Freq: Every day | ORAL | 1 refills | Status: AC

## 2017-11-25 MED ORDER — NAPROXEN 500 MG PO TABS: 500.0000 mg | ORAL_TABLET | Freq: Two times a day (BID) | ORAL | 1 refills | Status: AC

## 2017-11-25 MED ORDER — METFORMIN HCL 500 MG PO TABS: 1000.0000 mg | ORAL_TABLET | Freq: Every day | ORAL | 3 refills | Status: DC

## 2017-11-25 MED ORDER — LOSARTAN POTASSIUM-HCTZ 100-12.5 MG PO TABS: 1.0000 | ORAL_TABLET | Freq: Every day | ORAL | 0 refills | Status: DC

## 2017-11-25 MED ORDER — GABAPENTIN 300 MG PO CAPS
600.0000 mg | ORAL_CAPSULE | Freq: Every day | ORAL | 3 refills | Status: DC
Start: 2017-11-25 — End: 2018-05-05

## 2017-11-25 MED ORDER — METHOCARBAMOL 500 MG PO TABS: 1000.0000 mg | ORAL_TABLET | Freq: Three times a day (TID) | ORAL | 1 refills | Status: AC | PRN

## 2017-11-25 MED ORDER — DICLOFENAC SODIUM 1 % TD GEL: 2.0000 g | Freq: Four times a day (QID) | TRANSDERMAL | 2 refills | Status: AC

## 2017-11-25 MED FILL — METHOCARBAMOL 500 MG TABS: 500 | 15 days supply | Qty: 90 | Fill #0

## 2017-11-25 MED FILL — GABAPENTIN 300 MG CAPSULE: 300 | 30 days supply | Qty: 60 | Fill #0

## 2017-11-25 MED FILL — metFORMIN HCL 500 MG TABS: 500 | 45 days supply | Qty: 90 | Fill #0

## 2017-11-25 NOTE — Progress Notes (Signed)
Patient ID: Cheryl Manning, female   DOB: [birth year], 37 y.o.   MRN: [id number]     Cheryl Manning, is a 37 y.o. female  [id number]:[id number]  [id number]:[id number]  DOB - [birth year]  Subjective:  Chief Complaint and HPI: Cheryl Manning is a 37 y.o. female here today for med RF and other issues.  She continues to have LBP despite injections in June.  No new radicular s/sx.  meloxicam not helping.    Also c/o "joint pain all over at times."  Seems more so in the morning.  I checked her sed rate in May and it was slightly elevated at 37.  She is taking OTC vitamin D.  No tick bites.  No f/c.    Myrna with [employer] interpreters.    ROS:   Constitutional:  No f/c, No night sweats, No unexplained weight loss. EENT:  No vision changes, No blurry vision, No hearing changes. No mouth, throat, or ear problems.  Respiratory: No cough, No SOB Cardiac: No CP, no palpitations GI:  No abd pain, No N/V/D. GU: No Urinary s/sx Musculoskeletal: +joint pain, +LBP Neuro: No headache, no dizziness, no motor weakness.  Skin: No rash Endocrine:  No polydipsia. No polyuria.  Psych: Denies SI/HI  No problems updated.  ALLERGIES: Allergies  Allergen Reactions  . Cinnamon Anaphylaxis    PAST MEDICAL HISTORY: Past Medical History:  Diagnosis Date  . HTN (hypertension) 06/24/2016  . Prediabetes 01/27/2017    MEDICATIONS AT HOME: Prior to Admission medications   Medication Sig Start Date End Date Taking? Authorizing Provider  cetirizine (ZYRTEC) 10 MG tablet Take 1 tablet (10 mg total) by mouth daily. 10/07/16   Marcine MatarJohnson, Deborah B, MD  diclofenac sodium (VOLTAREN) 1 % GEL Apply 2 g topically 4 (four) times daily. 11/25/17   Anders SimmondsMcClung, Elmar Antigua M, PA-C  fluticasone (FLONASE) 50 MCG/ACT nasal spray Place 2 sprays into both nostrils daily. 10/07/16   Marcine MatarJohnson, Deborah B, MD  gabapentin (NEURONTIN) 300 MG capsule Take 2 capsules (600 mg total) by mouth at bedtime. 11/25/17   Anders SimmondsMcClung, Aristide Waggle M, PA-C    losartan-hydrochlorothiazide (HYZAAR) 100-12.5 MG tablet Take 1 tablet by mouth daily. 11/25/17   Anders SimmondsMcClung, Syed Zukas M, PA-C  medroxyPROGESTERone (PROVERA) 10 MG tablet Take one tab daily for 7 days the third week of every month to induce menstrual periods 10/07/16   Marcine MatarJohnson, Deborah B, MD  metFORMIN (GLUCOPHAGE) 500 MG tablet Take 2 tablets (1,000 mg total) by mouth daily with breakfast. Dose increase 11/25/17   Anders SimmondsMcClung, Carmellia Kreisler M, PA-C  methocarbamol (ROBAXIN) 500 MG tablet Take 2 tablets (1,000 mg total) by mouth every 8 (eight) hours as needed for muscle spasms. 11/25/17   Anders SimmondsMcClung, Anyela Napierkowski M, PA-C  naproxen (NAPROSYN) 500 MG tablet Take 1 tablet (500 mg total) by mouth 2 (two) times daily with a meal. Prn pain 11/25/17   Anders SimmondsMcClung, Kristelle Cavallaro M, PA-C  Polyethyl Glycol-Propyl Glycol (SYSTANE) 0.4-0.3 % GEL ophthalmic gel Place 1 application into both eyes at bedtime as needed. 04/08/17   Cathie HoopsYu, Amy V, PA-C     Objective:  EXAM:   Vitals:   11/25/17 1523  BP: 130/87  Pulse: 80  Resp: 16  Temp: 98.6 F (37 C)  TempSrc: Oral  SpO2: 96%  Weight: 278 lb 9.6 oz (126.4 kg)    General appearance : A&OX3. NAD. Non-toxic-appearing HEENT: Atraumatic and Normocephalic.  PERRLA. EOM intact.   Neck: supple, no JVD. No cervical lymphadenopathy. No thyromegaly Chest/Lungs:  Breathing-non-labored, Good air entry bilaterally, breath sounds normal  without rales, rhonchi, or wheezing  CVS: S1 S2 regular, no murmurs, gallops, rubs  DTR=B.  Neg SLR B. Extremities: Bilateral Lower Ext shows no edema, both legs are warm to touch with = pulse throughout Neurology:  CN II-XII grossly intact, Non focal.   Psych:  TP linear. J/I WNL. Normal speech. Appropriate eye contact and affect.  Skin:  No Rash  Data Review Lab Results  Component Value Date   HGBA1C 5.7 11/25/2017   HGBA1C 6.1 05/11/2017   HGBA1C 6.1 10/07/2016     Assessment & Plan   1. Degenerative disc disease, lumbar Unchanged-F/up Dr Alvester Morin - gabapentin  (NEURONTIN) 300 MG capsule; Take 2 capsules (600 mg total) by mouth at bedtime.  Dispense: 60 capsule; Refill: 3 - methocarbamol (ROBAXIN) 500 MG tablet; Take 2 tablets (1,000 mg total) by mouth every 8 (eight) hours as needed for muscle spasms.  Dispense: 90 tablet; Refill: 1 Stop meloxcam, try- naproxen (NAPROSYN) 500 MG tablet; Take 1 tablet (500 mg total) by mouth 2 (two) times daily with a meal. Prn pain  Dispense: 60 tablet; Refill: 1 -weight loss would be helpful  2. Prediabetes Controlled.  Continue current regimen - Glucose (CBG) - HgB A1c - metFORMIN (GLUCOPHAGE) 500 MG tablet; Take 2 tablets (1,000 mg total) by mouth daily with breakfast. Dose increase  Dispense: 90 tablet; Refill: 3  3. Essential hypertension Controlled;  Continue current regimwn - losartan-hydrochlorothiazide (HYZAAR) 100-12.5 MG tablet; Take 1 tablet by mouth daily.  Dispense: 90 tablet; Refill: 1  4. Vitamin D deficiency - Vitamin D, 25-hydroxy  5. Arthralgia, unspecified joint - Sedimentation Rate  6. Language barrier [employer] interpreters used and additional time performing visit was required.  Patient have been counseled extensively about nutrition and exercise  Return in about 3 months (around 02/25/2018) for Dr Alvis Lemmings; BP and prediabetes.  The patient was given clear instructions to go to ER or return to medical center if symptoms don't improve, worsen or new problems develop. The patient verbalized understanding. The patient was told to call to get lab results if they haven't heard anything in the next week.     Georgian Co, PA-C [facility] [place], [place] [phone]   11/25/2017, 3:53 PM

## 2017-11-26 ENCOUNTER — Other Ambulatory Visit: Payer: Self-pay | Admitting: Physician Assistant

## 2017-11-26 ENCOUNTER — Encounter: Payer: Self-pay | Admitting: Physician Assistant

## 2017-11-26 DIAGNOSIS — R700 Elevated erythrocyte sedimentation rate: Secondary | ICD-10-CM

## 2017-11-26 DIAGNOSIS — M2550 Pain in unspecified joint: Secondary | ICD-10-CM

## 2017-11-26 LAB — SEDIMENTATION RATE: Sed Rate: 41 mm/hr — ABNORMAL HIGH (ref 0–32)

## 2017-11-26 LAB — VITAMIN D 25 HYDROXY (VIT D DEFICIENCY, FRACTURES): Vit D, 25-Hydroxy: 47.3 ng/mL (ref 30.0–100.0)

## 2017-11-27 ENCOUNTER — Telehealth: Payer: Self-pay

## 2017-11-27 NOTE — Telephone Encounter (Signed)
[employer] Kern AlbertaSergio [id number]#[id number]   contacted pt to go over lab results pt didn't answer left a detailed vm informing pt of results and if she has any questions or concerns to give me a call back    If pt calls back please give results: Her inflammation rate is still mildly elevated. I am referring her to a specialist for her joint pain because of this. Follow-up as planned

## 2017-12-04 NOTE — Progress Notes (Signed)
Office Visit Note  Patient: Cheryl LollCinthia Manning             Date of Birth: Feb 07, 1981           MRN: [id number]             PCP: Hoy RegisterNewlin, Enobong, MD Referring: Hoy RegisterNewlin, Enobong, MD Visit Date: 12/17/2017 Occupation: @GUAROCC @  Interpreter: Murlean CallerAna Pevida   Subjective:  Elevated sedimentation rate.   History of Present Illness: Cheryl Manning is a 37 y.o. female seen in consultation per request of her PCP.  According to patient her symptoms are started few months ago when she [facility] some heavy [facility] [employer] [facility].  She started having severe lower back pain.  She was initially seen by Dr. Roda ShuttersXu and then she had injection in her lumbar spine without much relief.  She continues to have lower back pain.  She states she has been also diagnosed with bilateral lateral epicondylitis which causes discomfort.  She has bilateral carpal tunnel syndrome which causes discomfort in her hands.  She denies any joint swelling.  She was referred to me because her labs showed elevated sedimentation rate.  She states there is positive family history of rheumatoid arthritis in her father.  Activities of Daily Living:  Patient reports morning stiffness for 1 hour.   Patient Reports nocturnal pain.  Difficulty dressing/grooming: Reports Difficulty climbing stairs: Reports Difficulty getting out of chair: Reports Difficulty using hands for taps, buttons, cutlery, and/or writing: Reports  Review of Systems  Constitutional: Negative for fatigue.  HENT: Positive for mouth dryness. Negative for mouth sores, trouble swallowing and trouble swallowing.   Eyes: Positive for dryness. Negative for itching.  Respiratory: Negative for shortness of breath and difficulty breathing.   Cardiovascular: Negative for chest pain and swelling in legs/feet.  Gastrointestinal: Negative for abdominal pain, constipation, diarrhea, nausea and vomiting.  Endocrine: Negative for increased urination.  Genitourinary:  Negative for nocturia and pelvic pain.  Musculoskeletal: Positive for arthralgias, joint pain, joint swelling and morning stiffness. Negative for gait problem.  Skin: Positive for rash. Negative for hair loss.  Allergic/Immunologic: Negative for susceptible to infections.  Neurological: Negative for dizziness, light-headedness, headaches, memory loss and weakness.  Hematological: Negative for bruising/bleeding tendency.  Psychiatric/Behavioral: Negative for confusion.    PMFS History:  Patient Active Problem List   Diagnosis Date Noted  . Elevated sed rate 11/26/2017  . Prediabetes 01/27/2017  . ASCUS with positive high risk HPV cervical 11/10/2016  . Diabetes mellitus screening 10/07/2016  . Seasonal allergic rhinitis due to pollen 10/07/2016  . Lateral epicondylitis of both elbows 10/07/2016  . Bilateral carpal tunnel syndrome 10/07/2016  . Left breast lump 10/07/2016  . Secondary amenorrhea 10/07/2016  . HTN (hypertension) 06/24/2016    Past Medical History:  Diagnosis Date  . HTN (hypertension) 06/24/2016  . Prediabetes 01/27/2017    Family History  Problem Relation Age of Onset  . Diabetes Father   . Hypertension Father   . Uterine cancer Sister   . Hypertension Brother   . Drug abuse Paternal Uncle   . Uterine cancer Maternal Grandmother   . Uterine cancer Paternal Grandmother   . Breast cancer Paternal Aunt   . Cancer Mother    History reviewed. No pertinent surgical history. Social History   Social History Narrative  . Not on file    Objective: Vital Signs: BP (!) 141/97 (BP Location: Right Arm, Patient Position: Sitting, Cuff Size: Large)   Pulse 87   Resp 16  Ht 5\' 5"  (1.651 m)   Wt 284 lb (128.8 kg)   BMI 47.26 kg/m    Physical Exam  Constitutional: She is oriented to person, place, and time. She appears well-developed and well-nourished.  HENT:  Head: Normocephalic and atraumatic.  Eyes: Conjunctivae and EOM are normal.  Neck: Normal range of  motion.  Cardiovascular: Normal rate, regular rhythm, normal heart sounds and intact distal pulses.  Pulmonary/Chest: Effort normal and breath sounds normal.  Abdominal: Soft. Bowel sounds are normal.  Lymphadenopathy:    She has no cervical adenopathy.  Neurological: She is alert and oriented to person, place, and time.  Skin: Skin is warm and dry. Capillary refill takes less than 2 seconds.  Psychiatric: She has a normal mood and affect. Her behavior is normal.  Nursing note and vitals reviewed.    Musculoskeletal Exam: C-spine thoracic spine good range of motion.  She has discomfort range of motion of her lumbar spine.  She has tenderness over SI joints.  Shoulder joints elbow joints wrist joints MCPs PIPs DIPs were in good range of motion with no synovitis.  She had tenderness over bilateral lateral epicondyle.  Hip joints knee joints ankles MTPs PIPs were in good range of motion with no synovitis.  CDAI Exam: CDAI Score: Not documented Patient Global Assessment: Not documented; Provider Global Assessment: Not documented Swollen: Not documented; Tender: Not documented Joint Exam   Not documented   There is currently no information documented on the homunculus. Go to the Rheumatology activity and complete the homunculus joint exam.  Investigation: Findings:  11/25/17: Sed rate 41, Vitamin D 47.3  Component     Latest Ref Rng & Units 11/25/2017  HbA1c, POC (prediabetic range)     5.7 - 6.4 % 5.7  Vitamin D, 25-Hydroxy     30.0 - 100.0 ng/mL 47.3  Sed Rate     0 - 32 mm/hr 41 (H)   Imaging: No results found.  Recent Labs: Lab Results  Component Value Date   WBC 10.1 06/24/2016   HGB 14.0 06/24/2016   PLT 420 (H) 06/24/2016   NA 142 09/09/2017   K 4.2 09/09/2017   CL 102 09/09/2017   CO2 24 09/09/2017   GLUCOSE 94 09/09/2017   BUN 13 09/09/2017   CREATININE 0.70 09/09/2017   BILITOT 0.3 09/09/2017   ALKPHOS 80 09/09/2017   AST 39 09/09/2017   ALT 45 (H) 09/09/2017     PROT 7.6 09/09/2017   ALBUMIN 4.7 09/09/2017   CALCIUM 9.7 09/09/2017   GFRAA 129 09/09/2017    Speciality Comments: No specialty comments available.  Procedures:  No procedures performed Allergies: Cinnamon   Assessment / Plan:     Visit Diagnoses: Polyarthralgia -patient complains of pain mostly in her lower back and her elbows.  She had cortisone injection to her back without much relief.  She was found to have elevated sedimentation rate.  11/25/17: Sed rate 41, Vitamin D 47.3.  I do not see any synovitis on examination today.  I will obtain additional labs as there is positive family history of rheumatoid arthritis.  Elevated sed rate - 09/09/17: Sed rate 378/7/19: Sed rate 41Plan: CBC with Differential/Platelet, Urinalysis, Routine w reflex microscopic, CK, Rheumatoid factor, Cyclic citrul peptide antibody, IgG, ANA, Serum protein electrophoresis with reflex.  I will contact her once the lab results are available.  Essential hypertension -her blood pressure is a still elevated.  Bilateral carpal tunnel syndrome-patient has been followed by orthopedics.  Lateral epicondylitis  of both elbows-she has been followed by Dr. Roda ShuttersXu  Prediabetes  ASCUS with positive high risk HPV cervical   Orders: Orders Placed This Encounter  Procedures  . CBC with Differential/Platelet  . Urinalysis, Routine w reflex microscopic  . CK  . Rheumatoid factor  . Cyclic citrul peptide antibody, IgG  . ANA  . Serum protein electrophoresis with reflex   No orders of the defined types were placed in this encounter.   Face-to-face time spent with patient was 50 minutes. Greater than 50% of time was spent in counseling and coordination of care.  Follow-Up Instructions: Return for Polyarthralgia and elevated sed rate.   Pollyann SavoyShaili Keah Lamba, MD  Note - This record has been created using [employer] software.  Chart creation errors have been sought, but may not always  have been located. Such creation  errors do not reflect on  the standard of medical care.

## 2017-12-05 ENCOUNTER — Encounter (HOSPITAL_COMMUNITY): Payer: Self-pay | Admitting: Emergency Medicine

## 2017-12-05 ENCOUNTER — Emergency Department (HOSPITAL_COMMUNITY)
Admission: EM | Admit: 2017-12-05 | Discharge: 2017-12-05 | Disposition: A | Discharge status: Home / Self Care | Payer: BLUE CROSS/BLUE SHIELD | Attending: Emergency Medicine | Admitting: Emergency Medicine

## 2017-12-05 ENCOUNTER — Other Ambulatory Visit: Payer: Self-pay

## 2017-12-05 DIAGNOSIS — G8929 Other chronic pain: Secondary | ICD-10-CM | POA: Insufficient documentation

## 2017-12-05 DIAGNOSIS — M546 Pain in thoracic spine: Secondary | ICD-10-CM | POA: Diagnosis not present

## 2017-12-05 DIAGNOSIS — I10 Essential (primary) hypertension: Secondary | ICD-10-CM | POA: Diagnosis not present

## 2017-12-05 DIAGNOSIS — R7303 Prediabetes: Secondary | ICD-10-CM | POA: Diagnosis not present

## 2017-12-05 DIAGNOSIS — M549 Dorsalgia, unspecified: Secondary | ICD-10-CM

## 2017-12-05 DIAGNOSIS — Z79899 Other long term (current) drug therapy: Secondary | ICD-10-CM | POA: Diagnosis not present

## 2017-12-05 DIAGNOSIS — Z7984 Long term (current) use of oral hypoglycemic drugs: Secondary | ICD-10-CM | POA: Diagnosis not present

## 2017-12-05 DIAGNOSIS — Z87891 Personal history of nicotine dependence: Secondary | ICD-10-CM | POA: Insufficient documentation

## 2017-12-05 DIAGNOSIS — M545 Low back pain, unspecified: Secondary | ICD-10-CM

## 2017-12-05 MED ORDER — OXYCODONE-ACETAMINOPHEN 5-325 MG PO TABS: 1.0000 | ORAL_TABLET | ORAL | 0 refills | Status: AC | PRN

## 2017-12-05 MED ORDER — OXYCODONE-ACETAMINOPHEN 5-325 MG PO TABS
1.0000 | ORAL_TABLET | Freq: Once | ORAL | Status: AC
  Administered 2017-12-05: 1 via ORAL
  Filled 2017-12-05: qty 1

## 2017-12-05 NOTE — ED Provider Notes (Signed)
[facility] Provider Note   CSN: [id number] Arrival date & time: 12/05/17  [id number]     History   Chief Complaint Chief Complaint  Patient presents with  . Back Pain    HPI Cheryl Manning is a 37 y.o. female.  Patient presents with complaint of back pain. She reports a history of lower back pain treated with injections by specialist, last injection was 3 months ago. Today she started having upper back pain after lifting an object from counter height while at work. It is described as a flare up of her lower back pain that shoots up her spine to her neck. There is pain and tingling that radiates into bilateral UE's. No headache. No weakness of extremities. No urinary/bowel dysfunction. She is taking Robaxin, gabapentin and Naproxen daily to manage her chronic pain and these are not giving her relief. Symptoms have become progressively worse through the day.  The history is provided by the patient. A language interpreter was used (Via video interpreter).    Past Medical History:  Diagnosis Date  . HTN (hypertension) 06/24/2016  . Prediabetes 01/27/2017    Patient Active Problem List   Diagnosis Date Noted  . Elevated sed rate 11/26/2017  . Prediabetes 01/27/2017  . ASCUS with positive high risk HPV cervical 11/10/2016  . Diabetes mellitus screening 10/07/2016  . Seasonal allergic rhinitis due to pollen 10/07/2016  . Lateral epicondylitis of both elbows 10/07/2016  . Bilateral carpal tunnel syndrome 10/07/2016  . Left breast lump 10/07/2016  . Secondary amenorrhea 10/07/2016  . HTN (hypertension) 06/24/2016    History reviewed. No pertinent surgical history.   OB History   None      Home Medications    Prior to Admission medications   Medication Sig Start Date End Date Taking? Authorizing Provider  cetirizine (ZYRTEC) 10 MG tablet Take 1 tablet (10 mg total) by mouth daily. 10/07/16   Marcine MatarJohnson, Deborah B, MD  diclofenac sodium  (VOLTAREN) 1 % GEL Apply 2 g topically 4 (four) times daily. 11/25/17   Anders SimmondsMcClung, Angela M, PA-C  fluticasone (FLONASE) 50 MCG/ACT nasal spray Place 2 sprays into both nostrils daily. 10/07/16   Marcine MatarJohnson, Deborah B, MD  gabapentin (NEURONTIN) 300 MG capsule Take 2 capsules (600 mg total) by mouth at bedtime. 11/25/17   Anders SimmondsMcClung, Angela M, PA-C  losartan-hydrochlorothiazide (HYZAAR) 100-12.5 MG tablet Take 1 tablet by mouth daily. 11/25/17   Anders SimmondsMcClung, Angela M, PA-C  medroxyPROGESTERone (PROVERA) 10 MG tablet Take one tab daily for 7 days the third week of every month to induce menstrual periods 10/07/16   Marcine MatarJohnson, Deborah B, MD  metFORMIN (GLUCOPHAGE) 500 MG tablet Take 2 tablets (1,000 mg total) by mouth daily with breakfast. Dose increase 11/25/17   Anders SimmondsMcClung, Angela M, PA-C  methocarbamol (ROBAXIN) 500 MG tablet Take 2 tablets (1,000 mg total) by mouth every 8 (eight) hours as needed for muscle spasms. 11/25/17   Anders SimmondsMcClung, Angela M, PA-C  naproxen (NAPROSYN) 500 MG tablet Take 1 tablet (500 mg total) by mouth 2 (two) times daily with a meal. Prn pain 11/25/17   Anders SimmondsMcClung, Angela M, PA-C  Polyethyl Glycol-Propyl Glycol (SYSTANE) 0.4-0.3 % GEL ophthalmic gel Place 1 application into both eyes at bedtime as needed. 04/08/17   Belinda FisherYu, Amy V, PA-C    Family History Family History  Problem Relation Age of Onset  . Diabetes Father   . Hypertension Father   . Uterine cancer Sister   . Hypertension Brother   .  Drug abuse Paternal Uncle   . Uterine cancer Maternal Grandmother   . Uterine cancer Paternal Grandmother   . Breast cancer Paternal Aunt     Social History Social History   Tobacco Use  . Smoking status: Former Games developermoker  . Smokeless tobacco: Never Used  Substance Use Topics  . Alcohol use: Not on file  . Drug use: Not on file     Allergies   Cinnamon   Review of Systems Review of Systems  Constitutional: Negative for chills and fever.  Respiratory: Negative.  Negative for shortness of breath.     Cardiovascular: Negative.  Negative for chest pain and leg swelling.  Gastrointestinal: Negative.  Negative for abdominal pain.  Genitourinary: Negative for enuresis.  Musculoskeletal: Positive for back pain and neck pain. Negative for arthralgias.  Skin: Negative.  Negative for color change.  Neurological: Negative.  Negative for weakness and headaches.       See HPI.     Physical Exam Updated Vital Signs BP (!) 163/92 (BP Location: Right Arm)   Pulse 100   Temp 98.1 F (36.7 C) (Oral)   Resp 16   LMP 11/04/2017 (Approximate)   SpO2 98%   Physical Exam  Constitutional: She is oriented to person, place, and time. She appears well-developed and well-nourished.  Neck: Normal range of motion.  Cardiovascular: Normal rate.  Pulmonary/Chest: Effort normal. No respiratory distress. She exhibits no tenderness.  Abdominal: There is no tenderness.  Musculoskeletal: Normal range of motion.       Arms: Equal grip strength on grip, bicep, tricep in UE's. Full and equal strength in the lower extremities. She is ambulatory and fully weight bearing on bilateral LE's equally. There is midline and bilateral spinal tenderness of entire spine, more pronounced at cervical and lumbar regions.   Neurological: She is alert and oriented to person, place, and time. She exhibits normal muscle tone. Coordination normal.  Slightly decreased sensation to light touch in right UE, generalized.   Skin: Skin is warm and dry.     ED Treatments / Results  Labs (all labs ordered are listed, but only abnormal results are displayed) Labs Reviewed - No data to display  EKG None  Radiology No results found.  Procedures Procedures (including critical care time)  Medications Ordered in ED Medications  oxyCODONE-acetaminophen (PERCOCET/ROXICET) 5-325 MG per tablet 1 tablet (has no administration in time range)     Initial Impression / Assessment and Plan / ED Course  I have reviewed the triage vital  signs and the nursing notes.  Pertinent labs & imaging results that were available during my care of the patient were reviewed by me and considered in my medical decision making (see chart for details).     Discussed with Dr. Rhunette CroftNanavati. Patient with upper and lower back pain since earlier today after lifting object from counter height. She reports tingling sensation in UE with slight discrepancy in sensation to light touch in UE's. No strength deficits. Normal coordination. Pain is reproducible to palpation over entire spine. No neurologic red flags.   Patient can be discharged home. Will add Percocet for pain control. She has an already scheduled appointment with her specialist and is encouraged to keep this for recheck. Return precautions discussed.   Final Clinical Impressions(s) / ED Diagnoses   Final diagnoses:  None   1. Acute on chronic low back pain 2. Upper back pain  ED Discharge Orders    None       Elpidio AnisUpstill, Brogen Duell,  PA-C 12/06/17 2225    Derwood Kaplan, MD 12/11/17 0020

## 2017-12-05 NOTE — ED Triage Notes (Signed)
Pt c/o back pain after injuring her back at work. Pt states she was lifting some boxes this morning and felt something pop. spanish interpretor utilized for triage. Pt endorses some tingling in lower back. Denies any issues with incontinence. Ambulatory to triage.

## 2017-12-17 ENCOUNTER — Ambulatory Visit: Payer: BLUE CROSS/BLUE SHIELD | Admitting: Rheumatology

## 2017-12-17 ENCOUNTER — Encounter: Payer: Self-pay | Admitting: Rheumatology

## 2017-12-17 VITALS — BP 141/97 | HR 87 | Resp 16 | Ht 65.0 in | Wt 284.0 lb

## 2017-12-17 DIAGNOSIS — R87610 Atypical squamous cells of undetermined significance on cytologic smear of cervix (ASC-US): Secondary | ICD-10-CM

## 2017-12-17 DIAGNOSIS — M7712 Lateral epicondylitis, left elbow: Secondary | ICD-10-CM

## 2017-12-17 DIAGNOSIS — R7303 Prediabetes: Secondary | ICD-10-CM

## 2017-12-17 DIAGNOSIS — R700 Elevated erythrocyte sedimentation rate: Secondary | ICD-10-CM

## 2017-12-17 DIAGNOSIS — M2550 Pain in unspecified joint: Secondary | ICD-10-CM

## 2017-12-17 DIAGNOSIS — G5603 Carpal tunnel syndrome, bilateral upper limbs: Secondary | ICD-10-CM

## 2017-12-17 DIAGNOSIS — M7711 Lateral epicondylitis, right elbow: Secondary | ICD-10-CM

## 2017-12-17 DIAGNOSIS — R87810 Cervical high risk human papillomavirus (HPV) DNA test positive: Secondary | ICD-10-CM

## 2017-12-17 DIAGNOSIS — I10 Essential (primary) hypertension: Secondary | ICD-10-CM

## 2017-12-23 LAB — URINALYSIS, ROUTINE W REFLEX MICROSCOPIC
BILIRUBIN URINE: NEGATIVE
GLUCOSE, UA: NEGATIVE
Hgb urine dipstick: NEGATIVE
KETONES UR: NEGATIVE
Leukocytes, UA: NEGATIVE
Nitrite: NEGATIVE
PH: 8 (ref 5.0–8.0)
Protein, ur: NEGATIVE
SPECIFIC GRAVITY, URINE: 1.022 (ref 1.001–1.03)

## 2017-12-23 LAB — PROTEIN ELECTROPHORESIS, SERUM, WITH REFLEX
ALBUMIN ELP: 4 g/dL (ref 3.8–4.8)
ALPHA 1: 0.2 g/dL (ref 0.2–0.3)
ALPHA 2: 0.8 g/dL (ref 0.5–0.9)
BETA GLOBULIN: 0.5 g/dL (ref 0.4–0.6)
Beta 2: 0.5 g/dL (ref 0.2–0.5)
Gamma Globulin: 1.2 g/dL (ref 0.8–1.7)
Total Protein: 7.4 g/dL (ref 6.1–8.1)

## 2017-12-23 LAB — IFE INTERPRETATION: Immunofix Electr Int: NOT DETECTED

## 2017-12-23 LAB — ANA: ANA: NEGATIVE

## 2017-12-23 LAB — CYCLIC CITRUL PEPTIDE ANTIBODY, IGG: Cyclic Citrullin Peptide Ab: <16 UNITS

## 2017-12-23 LAB — RHEUMATOID FACTOR

## 2017-12-23 LAB — CK: CK TOTAL: 120 U/L (ref 29–143)

## 2017-12-23 NOTE — Progress Notes (Signed)
All the labs are within normal limits.  If she is still having ongoing discomfort in her hands we may consider scheduling ultrasound of bilateral hands.  Please discuss with patient.

## 2017-12-23 NOTE — Progress Notes (Deleted)
Office Visit Note  Patient: Cheryl Manning             Date of Birth: 1981/01/02           MRN: [id number]             PCP: Hoy Register, MD Referring: Hoy Register, MD Visit Date: 01/06/2018 Occupation: @GUAROCC @  Subjective:  No chief complaint on file.   History of Present Illness: Cheryl Manning is a 37 y.o. female ***   Activities of Daily Living:  Patient reports morning stiffness for *** {minute/hour:19697}.   Patient {ACTIONS;DENIES/REPORTS:21021675::"Denies"} nocturnal pain.  Difficulty dressing/grooming: {ACTIONS;DENIES/REPORTS:21021675::"Denies"} Difficulty climbing stairs: {ACTIONS;DENIES/REPORTS:21021675::"Denies"} Difficulty getting out of chair: {ACTIONS;DENIES/REPORTS:21021675::"Denies"} Difficulty using hands for taps, buttons, cutlery, and/or writing: {ACTIONS;DENIES/REPORTS:21021675::"Denies"}  No Rheumatology ROS completed.   PMFS History:  Patient Active Problem List   Diagnosis Date Noted  . Elevated sed rate 11/26/2017  . Prediabetes 01/27/2017  . ASCUS with positive high risk HPV cervical 11/10/2016  . Diabetes mellitus screening 10/07/2016  . Seasonal allergic rhinitis due to pollen 10/07/2016  . Lateral epicondylitis of both elbows 10/07/2016  . Bilateral carpal tunnel syndrome 10/07/2016  . Left breast lump 10/07/2016  . Secondary amenorrhea 10/07/2016  . HTN (hypertension) 06/24/2016    Past Medical History:  Diagnosis Date  . HTN (hypertension) 06/24/2016  . Prediabetes 01/27/2017    Family History  Problem Relation Age of Onset  . Diabetes Father   . Hypertension Father   . Uterine cancer Sister   . Hypertension Brother   . Drug abuse Paternal Uncle   . Uterine cancer Maternal Grandmother   . Uterine cancer Paternal Grandmother   . Breast cancer Paternal Aunt   . Cancer Mother    No past surgical history on file. Social History   Social History Narrative  . Not on file    Objective: Vital Signs: There  were no vitals taken for this visit.   Physical Exam   Musculoskeletal Exam: ***  CDAI Exam: CDAI Score: Not documented Patient Global Assessment: Not documented; Provider Global Assessment: Not documented Swollen: Not documented; Tender: Not documented Joint Exam   Not documented   There is currently no information documented on the homunculus. Go to the Rheumatology activity and complete the homunculus joint exam.  Investigation: No additional findings.  Imaging: No results found.  Recent Labs: Lab Results  Component Value Date   WBC 10.1 06/24/2016   HGB 14.0 06/24/2016   PLT 420 (H) 06/24/2016   NA 142 09/09/2017   K 4.2 09/09/2017   CL 102 09/09/2017   CO2 24 09/09/2017   GLUCOSE 94 09/09/2017   BUN 13 09/09/2017   CREATININE 0.70 09/09/2017   BILITOT 0.3 09/09/2017   ALKPHOS 80 09/09/2017   AST 39 09/09/2017   ALT 45 (H) 09/09/2017   PROT 7.4 12/17/2017   ALBUMIN 4.7 09/09/2017   CALCIUM 9.7 09/09/2017   GFRAA 129 09/09/2017  Ua negative, IFE negative, CK120, ANA -, RF-, CCP Ab -  Speciality Comments: No specialty comments available.  Procedures:  No procedures performed Allergies: Cinnamon   Assessment / Plan:     Visit Diagnoses: No diagnosis found.   Orders: No orders of the defined types were placed in this encounter.  No orders of the defined types were placed in this encounter.   Face-to-face time spent with patient was *** minutes. Greater than 50% of time was spent in counseling and coordination of care.  Follow-Up Instructions: No follow-ups on file.  Bo Merino, MD  Note - This record has been created using Editor, commissioning.  Chart creation errors have been sought, but may not always  have been located. Such creation errors do not reflect on  the standard of medical care.

## 2017-12-24 ENCOUNTER — Telehealth: Payer: Self-pay | Admitting: Family Medicine

## 2017-12-24 MED FILL — LOSARTAN-HCTZ 100-12.5 MG T: 100-12.5 | 90 days supply | Qty: 90 | Fill #0

## 2017-12-24 NOTE — Telephone Encounter (Signed)
The patient had refills at the pharmacy, I placed the refill for her and it should be ready for pickup shortly.

## 2017-12-24 NOTE — Telephone Encounter (Signed)
Good Morning . Patient called requesting a refill of  losartan-hydrochlorothiazide (HYZAAR) 100-12.5 MG tablet . Patient use  [facility] thank you .

## 2018-01-04 MED FILL — GABAPENTIN 300 MG CAPSULE: 300 | 30 days supply | Qty: 60 | Fill #1

## 2018-01-06 ENCOUNTER — Encounter (INDEPENDENT_AMBULATORY_CARE_PROVIDER_SITE_OTHER): Payer: Self-pay | Admitting: Physical Medicine and Rehabilitation

## 2018-01-06 ENCOUNTER — Ambulatory Visit: Payer: BLUE CROSS/BLUE SHIELD | Admitting: Rheumatology

## 2018-01-06 NOTE — Progress Notes (Signed)
Cheryl Manning - 37 y.o. female MRN [id number]  Date of birth: 04/08/1981  Office Visit Note: Visit Date: 09/30/2017 PCP: Cheryl Register, MD Referred by: Cheryl Simmonds, PA-C  Subjective: Chief Complaint  Patient presents with  . Lower Back - Pain   HPI: [name] is a 37 year old female who comes in today at the request of her primary care provider Cheryl Co, PA-C for evaluation and management of chronic worsening low back pain and right more than left radicular leg pain.  Patient does have an interpreter with her today.  She is already seen in the office by Cheryl Manning for hand and upper extremity problems.  She has had recent lumbar spine films which were reviewed with the patient and reviewed below.  There is really only showed degenerative disc height changes at L4-5 with some facet arthropathy early for her age at 13.  There was no significant scoliosis or listhesis.  She reports 7 to 8 months of progressively worsening back pain but overall back pain for a couple of years.  No specific injury but issues with working and different minor traumatic events.  She can also have a stabbing pain on the left side that can cause her to have difficulty walking because of the catching sensation.  She has really pain all over her body in the upper extremities lower extremities back mid back.  She does not carry a diagnosis of fibromyalgia.  The pain down the right more than left leg is without real paresthesia or tingling.  She has not had focal weakness but is felt weak and some give way type symptoms.  She also reports bilateral knee pain.  She rates her average pain is 10 out of 10 although she sitting comfortably in the room.  She has not that I know of had rheumatologic work-up.  Her case is complicated by prediabetes as well as a history of a former smoker.  She has not had prior lumbar surgery or injections.   Review of Systems  Constitutional: Positive for  malaise/fatigue. Negative for chills, fever and weight loss.  HENT: Negative for hearing loss and sinus pain.   Eyes: Negative for blurred vision, double vision and photophobia.  Respiratory: Negative for cough and shortness of breath.   Cardiovascular: Negative for chest pain, palpitations and leg swelling.  Gastrointestinal: Negative for abdominal pain, nausea and vomiting.  Genitourinary: Negative for flank pain.  Musculoskeletal: Positive for back pain, joint pain and neck pain. Negative for myalgias.  Skin: Negative for itching and rash.  Neurological: Negative for tremors, focal weakness and weakness.  Endo/Heme/Allergies: Negative.   Psychiatric/Behavioral: Negative for depression.  All other systems reviewed and are negative.  Otherwise per HPI.  Assessment & Plan: Visit Diagnoses:  1. Chronic bilateral low back pain without sciatica   2. Bilateral chronic knee pain     Plan: Findings:  Chronic several year history of essentially mechanical back pain and a 37 year old female with minimal findings on x-ray although there are changes at L4-5 of disc height loss and some mild facet arthropathy.  She is getting radicular type complaint in the right leg more than the left but without paresthesia.  Her pain is been quite severe and uncontrolled with normal therapy and medications including anti-inflammatories.  She does have pain really all over her body and multiple arthritic and joint complaints.  I think the best approach given the amount of pain that she is having is to complete a general epidural injection  at L5-S1 which should spread throughout the lower spine and if this is more of a discogenic related type pain it should give her quite a bit of relief and be diagnostic.  We have to see how long the relief would last and we had a long discussion about this with her.  We discussed how cortisone medicines work and the benefits and downsides of using them.  She does not have any real red  flag complaints not to try an injection.  If she did not get much relief with the injection we would need to further look at MRI of the lumbar spine.  Another avenue of approach would be rheumatologic work-up.  She may have some underlying fibromyalgia.  We talked about activity modification as well as strengthening and exercise.    Meds & Orders: No orders of the defined types were placed in this encounter.  No orders of the defined types were placed in this encounter.   Follow-up: Return for Right L5-S1 interlaminar injection.   Procedures: No procedures performed  No notes on file   Clinical History: LUMBAR SPINE - 2-3 VIEW  COMPARISON:  None  FINDINGS: Osseous demineralization.  Five non-rib-bearing lumbar vertebra.  Disc space narrowing L4-L5.  Vertebral body and disc space heights otherwise maintained.  No acute fracture, subluxation, or bone destruction.  No gross evidence of spondylolysis.  SI joints preserved.  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   Electronically Signed   By: Cheryl Manning M.D.   On: 02/09/2017 15:21   She reports that she has quit smoking. She has never used smokeless tobacco.  Recent Labs    05/11/17 1650 11/25/17 1527  HGBA1C 6.1 5.7    Objective:  VS:  HT:    WT:   BMI:     BP:(!) 145/98  HR:79bpm  TEMP: ( )  RESP:  Physical Exam  Ortho Exam Imaging: No results found.  Past Medical/Family/Surgical/Social History: Medications & Allergies reviewed per EMR, new medications updated. Patient Active Problem List   Diagnosis Date Noted  . Elevated sed rate 11/26/2017  . Prediabetes 01/27/2017  . ASCUS with positive high risk HPV cervical 11/10/2016  . Diabetes mellitus screening 10/07/2016  . Seasonal allergic rhinitis due to pollen 10/07/2016  . Lateral epicondylitis of both elbows 10/07/2016  . Bilateral carpal tunnel syndrome 10/07/2016  . Left breast lump 10/07/2016  . Secondary amenorrhea  10/07/2016  . HTN (hypertension) 06/24/2016   Past Medical History:  Diagnosis Date  . HTN (hypertension) 06/24/2016  . Prediabetes 01/27/2017   Family History  Problem Relation Age of Onset  . Diabetes Father   . Hypertension Father   . Uterine cancer Sister   . Hypertension Brother   . Drug abuse Paternal Uncle   . Uterine cancer Maternal Grandmother   . Uterine cancer Paternal Grandmother   . Breast cancer Paternal Aunt   . Cancer Mother    History reviewed. No pertinent surgical history. Social History   Occupational History  . Not on file  Tobacco Use  . Smoking status: Former Games developermoker  . Smokeless tobacco: Never Used  Substance and Sexual Activity  . Alcohol use: Yes    Comment: occ  . Drug use: Never  . Sexual activity: Not on file

## 2018-01-06 NOTE — Progress Notes (Signed)
Office Visit Note  Patient: Cheryl Manning             Date of Birth: [birth year]           MRN: [id number]             PCP: Hoy RegisterNewlin, Enobong, MD Referring: Hoy RegisterNewlin, Enobong, MD Visit Date: 01/13/2018 Occupation: @GUAROCC @  Subjective:  Pain in elbows and lower back.   History of Present Illness: Cheryl Manning is a 37 y.o. female with history of elevated sedimentation rate and joint pain.  She continues to have pain over bilateral lateral epicondyle area.  She states she has been using tennis elbow brace.  She also complains of pain and stiffness in her bilateral hands with swelling.  She gives history of pain and stiffness in her bilateral feet.  She states she is seen a specialist for lower back pain and had cortisone injection without relief.  Activities of Daily Living:  Patient reports morning stiffness for all day hours.   Patient Reports nocturnal pain.  Difficulty dressing/grooming: Denies Difficulty climbing stairs: Reports Difficulty getting out of chair: Reports Difficulty using hands for taps, buttons, cutlery, and/or writing: Denies  Review of Systems  Constitutional: Positive for fatigue. Negative for night sweats, weight gain and weight loss.  HENT: Positive for mouth dryness. Negative for mouth sores, trouble swallowing, trouble swallowing and nose dryness.   Eyes: Negative for pain, redness, visual disturbance and dryness.  Respiratory: Negative for cough, shortness of breath and difficulty breathing.   Cardiovascular: Negative for chest pain, palpitations, hypertension, irregular heartbeat and swelling in legs/feet.  Gastrointestinal: Negative for blood in stool, constipation and diarrhea.  Endocrine: Negative for increased urination.  Genitourinary: Negative for vaginal dryness.  Musculoskeletal: Positive for arthralgias, joint pain, myalgias, morning stiffness and myalgias. Negative for joint swelling, muscle weakness and muscle tenderness.  Skin:  Negative for color change, rash, hair loss, skin tightness, ulcers and sensitivity to sunlight.  Allergic/Immunologic: Negative for susceptible to infections.  Neurological: Negative for dizziness, memory loss, night sweats and weakness.  Hematological: Negative for swollen glands.  Psychiatric/Behavioral: Positive for sleep disturbance. Negative for depressed mood. The patient is not nervous/anxious.     PMFS History:  Patient Active Problem List   Diagnosis Date Noted  . Elevated sed rate 11/26/2017  . Prediabetes 01/27/2017  . ASCUS with positive high risk HPV cervical 11/10/2016  . Diabetes mellitus screening 10/07/2016  . Seasonal allergic rhinitis due to pollen 10/07/2016  . Lateral epicondylitis of both elbows 10/07/2016  . Bilateral carpal tunnel syndrome 10/07/2016  . Left breast lump 10/07/2016  . Secondary amenorrhea 10/07/2016  . HTN (hypertension) 06/24/2016    Past Medical History:  Diagnosis Date  . HTN (hypertension) 06/24/2016  . Prediabetes 01/27/2017    Family History  Problem Relation Age of Onset  . Diabetes Father   . Hypertension Father   . Uterine cancer Sister   . Hypertension Brother   . Drug abuse Paternal Uncle   . Uterine cancer Maternal Grandmother   . Uterine cancer Paternal Grandmother   . Breast cancer Paternal Aunt   . Cancer Mother    History reviewed. No pertinent surgical history. Social History   Social History Narrative  . Not on file    Objective: Vital Signs: BP (!) 138/98 (BP Location: Left Arm, Patient Position: Sitting, Cuff Size: Large)   Pulse 86   Resp 16   Ht 5\' 5"  (1.651 m)   Wt 284 lb (128.8 kg)  BMI 47.26 kg/m    Physical Exam  Constitutional: She is oriented to person, place, and time. She appears well-developed and well-nourished.  HENT:  Head: Normocephalic and atraumatic.  Eyes: Conjunctivae and EOM are normal.  Neck: Normal range of motion.  Cardiovascular: Normal rate, regular rhythm, normal heart  sounds and intact distal pulses.  Pulmonary/Chest: Effort normal and breath sounds normal.  Abdominal: Soft. Bowel sounds are normal.  Lymphadenopathy:    She has no cervical adenopathy.  Neurological: She is alert and oriented to person, place, and time.  Skin: Skin is warm and dry. Capillary refill takes less than 2 seconds.  Mild facial erythema  Psychiatric: She has a normal mood and affect. Her behavior is normal.  Nursing note and vitals reviewed.    Musculoskeletal Exam: C-spine thoracic lumbar spine good range of motion.  She has some discomfort range of motion of her lumbar spine.  Shoulder joints elbow joints wrist joint MCPs PIPs DIPs been good range of motion with no synovitis.  Hip joints knee joints ankles MTPs PIPs been good range of motion with no synovitis.  CDAI Exam: CDAI Score: Not documented Patient Global Assessment: Not documented; Provider Global Assessment: Not documented Swollen: Not documented; Tender: Not documented Joint Exam   Not documented   There is currently no information documented on the homunculus. Go to the Rheumatology activity and complete the homunculus joint exam.  Investigation: No additional findings.  Imaging: Xr Foot 2 Views Left  Result Date: 01/13/2018 First MTP, PIP and DIP narrowing was noted.  No erosive changes were noted.  No intertarsal joint space narrowing was noted.  Posterior calcaneal spur was noted. Impression: These findings are consistent with mild osteoarthritic changes.  Xr Foot 2 Views Right  Result Date: 01/13/2018 First MTP, PIP and DIP narrowing was noted.  No erosive changes were noted.  No intertarsal joint space narrowing was noted.  Posterior calcaneal spur was noted. Impression: These findings are consistent with mild osteoarthritic changes.  Xr Hand 2 View Left  Result Date: 01/13/2018 No MCP changes were noted.  Minimal PIP narrowing was noted.  No DIP changes were noted.  No intercarpal radiocarpal  joint space narrowing was noted. 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  Xr Hand 2 View Right  Result Date: 01/13/2018 No MCP changes were noted.  Minimal PIP narrowing was noted.  No DIP changes were noted.  No intercarpal radiocarpal joint space narrowing was noted. 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   Recent Labs: Lab Results  Component Value Date   WBC 10.1 06/24/2016   HGB 14.0 06/24/2016   PLT 420 (H) 06/24/2016   NA 142 09/09/2017   K 4.2 09/09/2017   CL 102 09/09/2017   CO2 24 09/09/2017   GLUCOSE 94 09/09/2017   BUN 13 09/09/2017   CREATININE 0.70 09/09/2017   BILITOT 0.3 09/09/2017   ALKPHOS 80 09/09/2017   AST 39 09/09/2017   ALT 45 (H) 09/09/2017   PROT 7.4 12/17/2017   ALBUMIN 4.7 09/09/2017   CALCIUM 9.7 09/09/2017   GFRAA 129 09/09/2017  UA negative, IFE negative, ANA negative, RF negative, anti-CCP negative, CK normal Speciality Comments: No specialty comments available.  Procedures:  No procedures performed Allergies: Cinnamon   Assessment / Plan:     Visit Diagnoses: Elevated sed rate -sedimentation rate was 41.  All autoimmune work-up negative.  I do not see any synovitis on examination.  Pain in  both hands -patient had no synovitis on exam today.  She complains of intermittent swelling and pain in her hands.  We will also schedule ultrasound of her bilateral hands.  Plan: XR Hand 2 View Right, XR Hand 2 View Left.  The x-ray revealed only mild osteoarthritic changes in the PIP joints.  Pain in both feet -she complains of pain in her bilateral feet.  Plan: XR Foot 2 Views Right, XR Foot 2 Views Left.  The x-ray revealed mild osteoarthritic changes and posterior calcaneal spurs.  Lateral epicondylitis of both elbows-she  has been advised to use tennis elbow brace.  Chronic midline low back pain without sciatica - Status post cortisone injection.  Bilateral carpal  tunnel syndrome  Prediabetes  Essential hypertension   Orders: Orders Placed This Encounter  Procedures  . XR Foot 2 Views Right  . XR Foot 2 Views Left  . XR Hand 2 View Right  . XR Hand 2 View Left   No orders of the defined types were placed in this encounter.   Face-to-face time spent with patient was 30 minutes. Greater than 50% of time was spent in counseling and coordination of care.  Follow-Up Instructions: Return if symptoms worsen or fail to improve, for LBP.   Pollyann Savoy, MD  Note - This record has been created using Animal nutritionist.  Chart creation errors have been sought, but may not always  have been located. Such creation errors do not reflect on  the standard of medical care.

## 2018-01-13 ENCOUNTER — Ambulatory Visit (INDEPENDENT_AMBULATORY_CARE_PROVIDER_SITE_OTHER): Payer: Self-pay

## 2018-01-13 ENCOUNTER — Ambulatory Visit: Payer: BLUE CROSS/BLUE SHIELD | Admitting: Rheumatology

## 2018-01-13 ENCOUNTER — Encounter: Payer: Self-pay | Admitting: Rheumatology

## 2018-01-13 VITALS — BP 138/98 | HR 86 | Resp 16 | Ht 65.0 in | Wt 284.0 lb

## 2018-01-13 DIAGNOSIS — M7711 Lateral epicondylitis, right elbow: Secondary | ICD-10-CM

## 2018-01-13 DIAGNOSIS — R700 Elevated erythrocyte sedimentation rate: Secondary | ICD-10-CM

## 2018-01-13 DIAGNOSIS — R7303 Prediabetes: Secondary | ICD-10-CM

## 2018-01-13 DIAGNOSIS — G5603 Carpal tunnel syndrome, bilateral upper limbs: Secondary | ICD-10-CM

## 2018-01-13 DIAGNOSIS — M79642 Pain in left hand: Secondary | ICD-10-CM | POA: Diagnosis not present

## 2018-01-13 DIAGNOSIS — M79671 Pain in right foot: Secondary | ICD-10-CM

## 2018-01-13 DIAGNOSIS — M79672 Pain in left foot: Secondary | ICD-10-CM | POA: Diagnosis not present

## 2018-01-13 DIAGNOSIS — G8929 Other chronic pain: Secondary | ICD-10-CM

## 2018-01-13 DIAGNOSIS — M545 Low back pain, unspecified: Secondary | ICD-10-CM

## 2018-01-13 DIAGNOSIS — M7712 Lateral epicondylitis, left elbow: Secondary | ICD-10-CM

## 2018-01-13 DIAGNOSIS — I10 Essential (primary) hypertension: Secondary | ICD-10-CM

## 2018-01-13 DIAGNOSIS — M79641 Pain in right hand: Secondary | ICD-10-CM | POA: Diagnosis not present

## 2018-01-20 ENCOUNTER — Ambulatory Visit (INDEPENDENT_AMBULATORY_CARE_PROVIDER_SITE_OTHER): Payer: BLUE CROSS/BLUE SHIELD | Admitting: Rheumatology

## 2018-01-20 ENCOUNTER — Ambulatory Visit (INDEPENDENT_AMBULATORY_CARE_PROVIDER_SITE_OTHER): Payer: Self-pay

## 2018-01-20 DIAGNOSIS — M79641 Pain in right hand: Secondary | ICD-10-CM

## 2018-01-20 DIAGNOSIS — M79642 Pain in left hand: Secondary | ICD-10-CM

## 2018-01-27 ENCOUNTER — Ambulatory Visit: Payer: BLUE CROSS/BLUE SHIELD | Admitting: Rheumatology

## 2018-02-03 ENCOUNTER — Other Ambulatory Visit: Payer: Self-pay | Admitting: Internal Medicine

## 2018-02-03 DIAGNOSIS — J301 Allergic rhinitis due to pollen: Secondary | ICD-10-CM

## 2018-02-03 MED FILL — GABAPENTIN 300 MG CAPSULE: 300 | 30 days supply | Qty: 60 | Fill #2

## 2018-02-03 MED FILL — metFORMIN HCL 500 MG TABS: 500 | 45 days supply | Qty: 90 | Fill #1

## 2018-02-04 MED FILL — FLUTICASONE PROP 50 MCG SPR: 50 | 30 days supply | Qty: 16 | Fill #0

## 2018-03-03 ENCOUNTER — Ambulatory Visit: Payer: BLUE CROSS/BLUE SHIELD | Attending: Family Medicine | Admitting: Family Medicine

## 2018-03-03 ENCOUNTER — Encounter: Payer: Self-pay | Admitting: Family Medicine

## 2018-03-03 VITALS — BP 147/88 | HR 101 | Temp 98.0°F | Wt 289.0 lb

## 2018-03-03 DIAGNOSIS — I10 Essential (primary) hypertension: Secondary | ICD-10-CM | POA: Insufficient documentation

## 2018-03-03 DIAGNOSIS — E119 Type 2 diabetes mellitus without complications: Secondary | ICD-10-CM | POA: Insufficient documentation

## 2018-03-03 DIAGNOSIS — M545 Low back pain: Secondary | ICD-10-CM | POA: Insufficient documentation

## 2018-03-03 DIAGNOSIS — Z7984 Long term (current) use of oral hypoglycemic drugs: Secondary | ICD-10-CM | POA: Insufficient documentation

## 2018-03-03 DIAGNOSIS — E669 Obesity, unspecified: Secondary | ICD-10-CM | POA: Insufficient documentation

## 2018-03-03 DIAGNOSIS — G8929 Other chronic pain: Secondary | ICD-10-CM | POA: Insufficient documentation

## 2018-03-03 DIAGNOSIS — Z79899 Other long term (current) drug therapy: Secondary | ICD-10-CM | POA: Insufficient documentation

## 2018-03-03 DIAGNOSIS — R21 Rash and other nonspecific skin eruption: Secondary | ICD-10-CM

## 2018-03-03 DIAGNOSIS — L219 Seborrheic dermatitis, unspecified: Secondary | ICD-10-CM | POA: Diagnosis not present

## 2018-03-03 DIAGNOSIS — L509 Urticaria, unspecified: Secondary | ICD-10-CM | POA: Insufficient documentation

## 2018-03-03 DIAGNOSIS — Z6841 Body Mass Index (BMI) 40.0 and over, adult: Secondary | ICD-10-CM | POA: Insufficient documentation

## 2018-03-03 LAB — POCT GLYCOSYLATED HEMOGLOBIN (HGB A1C): HbA1c, POC (controlled diabetic range): 6.7 % (ref 0.0–7.0)

## 2018-03-03 LAB — GLUCOSE, POCT (MANUAL RESULT ENTRY): POC GLUCOSE: 106 mg/dL — AB (ref 70–99)

## 2018-03-03 MED ORDER — METFORMIN HCL 500 MG PO TABS: 1000.0000 mg | ORAL_TABLET | Freq: Two times a day (BID) | ORAL | 6 refills | Status: AC

## 2018-03-03 MED ORDER — KETOCONAZOLE 2 % EX SHAM: 1.0000 "application " | MEDICATED_SHAMPOO | CUTANEOUS | 0 refills | Status: AC

## 2018-03-03 MED ORDER — HYDROCORTISONE 1 % EX OINT: 1.0000 "application " | TOPICAL_OINTMENT | Freq: Two times a day (BID) | CUTANEOUS | 0 refills | Status: AC

## 2018-03-03 MED FILL — metFORMIN HCL 500 MG TABS: 500 | 30 days supply | Qty: 120 | Fill #0

## 2018-03-03 MED FILL — KETOCONAZOLE 2% SHAMPOO: 2 | 28 days supply | Qty: 120 | Fill #0

## 2018-03-03 NOTE — Patient Instructions (Signed)
Recuento de caloras para bajar de peso Calorie Counting for Weight Loss Las caloras son unidades de energa. Su cuerpo necesita una cierta cantidad de caloras de los alimentos para que le ayuden a pasar el da. Cuando come ms caloras de las que el cuerpo necesita, este acumula las caloras extra como grasa. Cuando come menos caloras de las que el cuerpo necesita, este quema grasa para obtener la energa que requiere. El recuento de caloras es el registro de la cantidad de caloras que come y bebe cada da. El recuento de caloras puede ser de ayuda si necesita perder peso. Si se asegura de comer menos caloras de las que el cuerpo necesita, debe bajar de peso. Pregntele al mdico cul es un peso sano para usted. Para que el recuento de caloras funcione, tendr que comer la cantidad de caloras adecuadas para usted en un da, para bajar una cantidad de peso saludable por semana. Un nutricionista puede determinar la cantidad de caloras que necesita por da y sugerirle cmo alcanzar su objetivo calrico.  Una cantidad de peso saludable para bajar por semana suele ser entre 1 y 2libras (0,5 a 0,9kg). Esto significa con frecuencia que su ingesta diaria de caloras se debera reducir unas 500 a 750caloras.  Ingerir 1200 a 1500caloras por da puede ayudar a la mayora de las mujeres a perder peso.  Ingerir de 1500 a 1800caloras por da puede ayudar a la mayora de los hombres a perder peso.  En qu consiste el plan? Mi objetivo es comer __________ caloras por da. Si como esta cantidad de caloras por da, debo bajar unas __________ libras por semana. Qu debo saber acerca del recuento de caloras? A fin de alcanzar su objetivo diario de caloras, tendr que:  Averiguar cuntas caloras hay en cada alimento que le gustara comer. Intente hacerlo antes de comer.  Decida la cantidad que puede comer del alimento.  Anote lo que comi y cuntas caloras tena. Esta tarea se conoce como  llevar un registro de comidas.  Para perder peso con xito es importante equilibrar el recuento de caloras con un estilo de vida saludable que incluya actividad fsica de forma regular. Tenga un objetivo de 150minutos de ejercicio moderado (como caminar) o 75 minutos de ejercicio vigoroso (como correr) todas las semanas. Dnde encuentro informacin sobre las caloras?  Es posible encontrar la cantidad de caloras que contiene un alimento en la etiqueta de informacin nutricional. Si un alimento no tiene una etiqueta de informacin nutricional, intente buscar las caloras en Internet o pida ayuda al nutricionista. Recuerde que las caloras se calculan por porcin. Si opta por comer ms de una porcin de un alimento, tendr que multiplicar las caloras por porcin por la cantidad de porciones que planea comer. Por ejemplo, la etiqueta de un envase de pan puede decir que el tamao de una porcin es 1rodaja, y que una porcin tiene 90caloras. Si come 1rodaja, habr comido 90caloras. Si come 2rodajas, habr comido 180caloras. Cmo llevo un registro de comidas? Despus de cada comida, registre la siguiente informacin en el registro de comidas:  Lo que comi. No olvide incluir los aderezos, las salsas y otros extras de la comida.  La cantidad que comi. Esto se puede medir en tazas, onzas o cantidad de alimentos.  Cuntas caloras ingiri por comida y por bebida.  La cantidad total de caloras en la comida.  Tenga a mano el registro de comidas, por ejemplo, en un anotador de bolsillo o utilice una aplicacin mvil o sitio   web. Algunos programas calcularn las caloras y le mostrarn la cantidad de caloras que le quedan para llegar al objetivo diario. Cules son algunos consejos para el recuento de caloras?  Use las caloras de los alimentos y las bebidas que lo sacien y no lo dejen con apetito: ? Algunos ejemplos de alimentos que lo sacian son los frutos secos y mantequillas de frutos  secos, verduras, protenas magras y alimentos con alto contenido de fibra como los cereales integrales. Los alimentos con alto contenido de fibra son aquellos que tienen ms de 5g de fibra por porcin. ? Las bebidas como los refrescos, especialmente las bebidas a base de caf y los jugos, que contienen muchas caloras, pero no le dan saciedad.  Coma alimentos nutritivos y evite las caloras vacas. Las caloras vacas son aquellas que se obtienen de los alimentos o las bebidas que no contienen muchos nutrientes ni protenas, como los dulces y los refrescos. Es mejor comer una comida nutritiva altamente calrica (como un aguacate) que una con pocos nutrientes (como una bolsa de patatas fritas).  Sepa cuntas caloras tienen los alimentos que come con ms frecuencia. Esto le ayudar a contar las caloras ms rpidamente.  Preste atencin a las caloras en las bebidas. Las bebidas de bajas caloras incluyen agua y refrescos sin azcar.  Preste atencin a las etiquetas nutricionales de alimentos "bajos en grasas" o "sin grasas". Estos alimentos a veces tienen la misma cantidad de caloras o ms caloras que las versiones ricas en grasa. Con frecuencia, tambin tienen agregados de azcar, almidn o sal, para darles el sabor que fue eliminado con la grasa.  Encuentre un mtodo para controlar las caloras que funcione para usted. Sea creativo. Pruebe aplicaciones o programas distintos, si llevar un registro de las caloras no funciona para usted. Cules son algunos consejos para controlar las porciones?  Sepa cuntas caloras hay en una porcin. Esto lo ayudar a saber cuntas porciones de un alimento determinado puede comer.  Use una taza medidora para medir los tamaos de las porciones. Tambin puede intentar pesar las porciones en una balanza de cocina. Con el tiempo, podr hacer un clculo estimativo de los tamaos de las porciones de algunos alimentos.  Dedique tiempo a poner porciones de  diferentes alimentos en sus platos, tazones y tazas predilectos, a fin de saber cmo se ve una porcin.  Intente no comer directamente de una bolsa o una caja. Esto puede llevarlo a comer en exceso. Ponga la cantidad que le gustara comer en una taza o un plato, a fin de asegurarse de que est comiendo la porcin correcta.  Use platos, vasos y tazones ms pequeos para no comer en exceso.  Intente no realizar varias tareas al mismo tiempo (como mirar la TV o usar su computadora) mientras come. Si es la hora de comer, sintese a la mesa y disfrute de la comida. Esto lo ayudar a reconocer cundo est satisfecho. Tambin le ayudar a tomar conciencia de lo que est comiendo y de la cantidad. Cules son algunos consejos para seguir este plan? Lectura de las etiquetas de los alimentos  Controle el recuento de caloras en comparacin con el tamao de la porcin. El tamao de la porcin puede ser ms pequeo de lo que suele comer.  Verifique la fuente de las caloras. Asegrese de que la comida que ingiere tenga alto contenido de vitaminas y protenas y sea baja en grasas saturadas y grasas trans. De compras  Lea las etiquetas nutricionales cuando compre. Esto le ayudar a   tomar decisiones ms saludables antes de comprar una comida.  Haga una lista para el almacn y resptela. La coccin  Intente cocinar sus alimentos preferidos de una manera ms saludable. Por ejemplo, pruebe hornear en vez de frer.  Utilice productos lcteos descremados. Planificacin de los alimentos  Utilice ms frutas y verduras. La mitad de sus platos debe ser de frutas y verduras.  Incluya protenas magras como el pollo y el pescado. Cmo puedo hacer el recuento de caloras cuando como afuera?  Pida porciones ms pequeas.  Considere la posibilidad de compartir un plato principal y las guarniciones, en lugar de pedir su propio plato principal.  Si pide su propio plato principal, coma solo la mitad. Pida una caja  al comienzo de la comida y ponga all el resto del plato principal, para no sentir la tentacin de comerlo.  Si se detallan las caloras en el men, elija las opciones que contengan la menor cantidad.  Elija platos que incluyan verduras, frutas, cereales integrales, productos lcteos con bajo contenido de grasa y protenas magras.  Opte por los alimentos hervidos, asados, cocidos a la parrilla o al vapor. No coma alimentos que contengan mantequilla, estn empanados, fritos o que se sirvan con salsa a base de crema. Generalmente, los alimentos que se etiquetan como "crujientes" estn fritos, a menos que se indique lo contrario.  Elija el agua, la leche descremada, el t helado sin azcar u otras bebidas que no contengan azcares agregados. Si desea una bebida alcohlica, escoja una opcin con menos caloras como una copa de vino o una cerveza ligera.  Ordene los aderezos, las salsas y los jarabes aparte. Estos son, con frecuencia, de alto contenido en caloras, por lo que debe limitar la cantidad que ingiere.  Si desea una ensalada, elija una de hortalizas y pida carnes a la parrilla. Evite las guarniciones adicionales como el tocino, el queso o los alimentos fritos. Ordene el aderezo aparte o pida aceite de oliva y vinagre o limn para aderezar.  Haga un clculo estimativo de la cantidad de porciones que le sirven. Por ejemplo, una porcin de arroz cocido equivale a media taza o la mitad del tamao de una pelota de bisbol. Conocer el tamao de las porciones lo ayudar a estar atento a la cantidad de comida que come en los restaurantes. La lista que sigue le muestra el tamao de algunas porciones comunes a partir de objetos cotidianos: ? 1onza (28g) = 4dados apilados. ? 3onzas (85g) = 1mazo de cartas. ? 1cucharadita = 1dado. ? 1cucharada = media pelota de tenis de mesa. ? 2cucharadas = 1pelota de tenis de mesa. ? Media taza = media pelota de bisbol. ? 1taza = 1 pelota de  bisbol. Resumen  El recuento de caloras es el registro de la cantidad de caloras que come y bebe cada da. Si come menos caloras de las que el cuerpo necesita, debe bajar de peso.  Una cantidad de peso saludable para bajar por semana suele ser entre 1 y 2libras (0,5 a 0,9kg). Esto significa, con frecuencia, reducir su ingesta diaria de caloras unas 500 a 750 caloras.  Es posible encontrar la cantidad de caloras que contiene un alimento en la etiqueta de informacin nutricional. Si un alimento no tiene una etiqueta de informacin nutricional, intente buscar las caloras en Internet o pida ayuda al nutricionista.  Use las caloras de los alimentos y las bebidas que lo sacien y no de los alimentos y las bebidas que lo dejan con apetito.  Use platos,   vasos y tazones ms pequeos para no comer en exceso. Esta informacin no tiene como fin reemplazar el consejo del mdico. Asegrese de hacerle al mdico cualquier pregunta que tenga. Document Released: 07/24/2008 Document Revised: 07/07/2016 Document Reviewed: 07/07/2016 [employer] Interactive Patient Education  2018 [employer]  

## 2018-03-03 NOTE — Progress Notes (Signed)
Subjective:  Patient ID: Cheryl Manning, female    DOB: 29-May-1980  Age: 37 y.o. MRN: [id number]  CC: Hypertension and Diabetes   HPI Cheryl Manning is a 37 year old female with a history of newly diagnosed type 2 diabetes mellitus with A1c of 6.7 (she was previously prediabetic with an A1c of 6.2) obesity who presents today for follow-up visit. She has chronic low back pain but was injured on the job at [employer] and is currently undergoing physical therapy with improvement in her symptoms. Her blood pressure is elevated despite taking her antihypertensive. She has gained 5 pounds in the last 3 months and states that could explain her elevated blood pressure and elevated A1c. A pruritic rash on her scalp and reddish spots on her face have her concerned.  She endorses itching of her scalp intermittently and uses medicated shampoo with no relief.  Sometimes has urticaria in her body especially around her neck.  Denies fever, has no allergies to foods or creams.  Past Medical History:  Diagnosis Date  . HTN (hypertension) 06/24/2016  . Prediabetes 01/27/2017    History reviewed. No pertinent surgical history.  Allergies  Allergen Reactions  . Cinnamon Anaphylaxis     Outpatient Medications Prior to Visit  Medication Sig Dispense Refill  . cetirizine (ZYRTEC) 10 MG tablet Take 1 tablet (10 mg total) by mouth daily. 30 tablet 6  . cyclobenzaprine (FLEXERIL) 10 MG tablet TAKE 1 TABLET BY MOUTH AT NIGHT AT BEDTIME MAY CAUSE DROWSINESS  0  . diclofenac sodium (VOLTAREN) 1 % GEL Apply 2 g topically 4 (four) times daily. (Patient not taking: Reported on 12/17/2017) 100 g 2  . fluticasone (FLONASE) 50 MCG/ACT nasal spray PLACE 2 SPRAYS INTO BOTH NOSTRILS DAILY. 16 g 0  . gabapentin (NEURONTIN) 300 MG capsule Take 2 capsules (600 mg total) by mouth at bedtime. 60 capsule 3  . ibuprofen (ADVIL,MOTRIN) 400 MG tablet Take 400 mg by mouth as needed.    Marland Kitchen losartan-hydrochlorothiazide  (HYZAAR) 100-12.5 MG tablet Take 1 tablet by mouth daily. 90 tablet 1  . medroxyPROGESTERone (PROVERA) 10 MG tablet Take one tab daily for 7 days the third week of every month to induce menstrual periods (Patient not taking: Reported on 12/17/2017) 7 tablet 6  . methocarbamol (ROBAXIN) 500 MG tablet Take 2 tablets (1,000 mg total) by mouth every 8 (eight) hours as needed for muscle spasms. (Patient not taking: Reported on 01/13/2018) 90 tablet 1  . naproxen (NAPROSYN) 500 MG tablet Take 1 tablet (500 mg total) by mouth 2 (two) times daily with a meal. Prn pain (Patient not taking: Reported on 12/17/2017) 60 tablet 1  . oxyCODONE-acetaminophen (PERCOCET/ROXICET) 5-325 MG tablet Take 1 tablet by mouth every 4 (four) hours as needed for severe pain. 8 tablet 0  . Polyethyl Glycol-Propyl Glycol (SYSTANE) 0.4-0.3 % GEL ophthalmic gel Place 1 application into both eyes at bedtime as needed. (Patient not taking: Reported on 12/17/2017) 1 Bottle 0  . metFORMIN (GLUCOPHAGE) 500 MG tablet Take 2 tablets (1,000 mg total) by mouth daily with breakfast. Dose increase 90 tablet 3   No facility-administered medications prior to visit.     ROS Review of Systems  Constitutional: Negative for activity change, appetite change and fatigue.  HENT: Negative for congestion, sinus pressure and sore throat.   Eyes: Negative for visual disturbance.  Respiratory: Negative for cough, chest tightness, shortness of breath and wheezing.   Cardiovascular: Negative for chest pain and palpitations.  Gastrointestinal: Negative for abdominal distention,  abdominal pain and constipation.  Endocrine: Negative for polydipsia.  Genitourinary: Negative for dysuria and frequency.  Musculoskeletal: Negative for arthralgias and back pain.  Skin: Positive for rash.  Neurological: Negative for tremors, light-headedness and numbness.  Hematological: Does not bruise/bleed easily.  Psychiatric/Behavioral: Negative for agitation and behavioral  problems.    Objective:  BP (!) 147/88   Pulse (!) 101   Temp 98 F (36.7 C) (Oral)   Wt 289 lb (131.1 kg)   SpO2 97%   BMI 48.09 kg/m   BP/Weight 03/03/2018 01/13/2018 04/30/6043  Systolic BP 409 811 914  Diastolic BP 88 98 97  Wt. (Lbs) 289 284 284  BMI 48.09 47.26 47.26      Physical Exam  Constitutional: She is oriented to person, place, and time. She appears well-developed and well-nourished.  Cardiovascular: Normal rate, normal heart sounds and intact distal pulses.  No murmur heard. Pulmonary/Chest: Effort normal and breath sounds normal. She has no wheezes. She has no rales. She exhibits no tenderness.  Abdominal: Soft. Bowel sounds are normal. She exhibits no distension and no mass. There is no tenderness.  Musculoskeletal: Normal range of motion.  Neurological: She is alert and oriented to person, place, and time.  Skin:  Erythematous patches on temporal aspect of hairline Bilaterally Rash right nasolabial fold and inferior septum of nose  Psychiatric: She has a normal mood and affect.     Lab Results  Component Value Date   HGBA1C 6.7 03/03/2018    Assessment & Plan:   1. Type 2 diabetes mellitus without complication, without long-term current use of insulin (HCC) A1cC of 6.7 which is up from 6.2 previously Increased dose of metformin Ice to work on weight loss, lifestyle modifications Counseled on Diabetic diet, my plate method, 782 minutes of moderate intensity exercise/week Keep blood sugar logs with fasting goals of 80-120 mg/dl, random of less than 180 and in the event of sugars less than 60 mg/dl or greater than 400 mg/dl please notify the clinic ASAP. It is recommended that you undergo annual eye exams and annual foot exams. Pneumonia vaccine is recommended. Diabetic healthcare maintenance at next visit - POCT glucose (manual entry) - POCT glycosylated hemoglobin (Hb A1C) - CMP14+EGFR; Future - Lipid panel; Future - metFORMIN (GLUCOPHAGE) 500  MG tablet; Take 2 tablets (1,000 mg total) by mouth 2 (two) times daily with a meal. Dose increase  Dispense: 120 tablet; Refill: 6 - Microalbumin/Creatinine Ratio, Urine  2. Rash - hydrocortisone 1 % ointment; Apply 1 application topically 2 (two) times daily.  Dispense: 30 g; Refill: 0  3. Seborrheic dermatitis of scalp - ketoconazole (NIZORAL) 2 % shampoo; Apply 1 application topically 2 (two) times a week.  Dispense: 120 mL; Refill: 0  4. Class 3 severe obesity due to excess calories without serious comorbidity with body mass index (BMI) of 45.0 to 49.9 in adult [facility]) Reduce portion sizes, increase physical activity, avoid late meals   Meds ordered this encounter  Medications  . metFORMIN (GLUCOPHAGE) 500 MG tablet    Sig: Take 2 tablets (1,000 mg total) by mouth 2 (two) times daily with a meal. Dose increase    Dispense:  120 tablet    Refill:  6  . hydrocortisone 1 % ointment    Sig: Apply 1 application topically 2 (two) times daily.    Dispense:  30 g    Refill:  0  . ketoconazole (NIZORAL) 2 % shampoo    Sig: Apply 1 application topically 2 (two)  times a week.    Dispense:  120 mL    Refill:  0    Follow-up: Return in about 3 months (around 06/03/2018) for Follow-up of chronic medical conditions.   Charlott Rakes MD

## 2018-03-16 MED FILL — METHOCARBAMOL 500 MG TABS: 500 | 15 days supply | Qty: 90 | Fill #1

## 2018-03-16 MED FILL — GABAPENTIN 300 MG CAPSULE: 300 | 30 days supply | Qty: 60 | Fill #3

## 2018-03-26 MED FILL — LOSARTAN-HCTZ 100-12.5 MG T: 100-12.5 | 90 days supply | Qty: 90 | Fill #1

## 2018-03-26 MED FILL — GABAPENTIN 300 MG CAPSULE: 300 | 30 days supply | Qty: 60 | Fill #3

## 2018-05-04 ENCOUNTER — Other Ambulatory Visit: Payer: Self-pay | Admitting: Physician Assistant

## 2018-05-04 DIAGNOSIS — M5136 Other intervertebral disc degeneration, lumbar region: Secondary | ICD-10-CM

## 2018-05-06 MED FILL — GABAPENTIN 300 MG CAPSULE: 300 | 30 days supply | Qty: 60 | Fill #0

## 2018-05-12 MED FILL — metFORMIN HCL 500 MG TABS: 500 | 30 days supply | Qty: 120 | Fill #1

## 2018-06-07 ENCOUNTER — Ambulatory Visit: Payer: BLUE CROSS/BLUE SHIELD | Admitting: Family Medicine

## 2018-06-09 ENCOUNTER — Telehealth: Payer: Self-pay | Admitting: *Deleted

## 2018-06-09 NOTE — Telephone Encounter (Signed)
Medical Assistant used [employer] to contact patient.  Interpreter Name: Phoebe Perch #: [id number] Voicemail states to give a call back to [place] with [facility] at [phone]. !!Please as patient the reason for no showing on Monday 06/07/2018. Please provide another appointment.!!!

## 2019-05-22 IMAGING — CR DG LUMBAR SPINE 2-3V
3 series · 3 of 3 positions shown · non-contrast
Comparison: None

CLINICAL DATA: Chronic low back pain for 2 months, BILATERAL lower
leg pain minimal history of 3 bulging discs, hypertension, diabetes
mellitus, former smoker

EXAM:
LUMBAR SPINE - 2-3 VIEW

[l-spine lat]
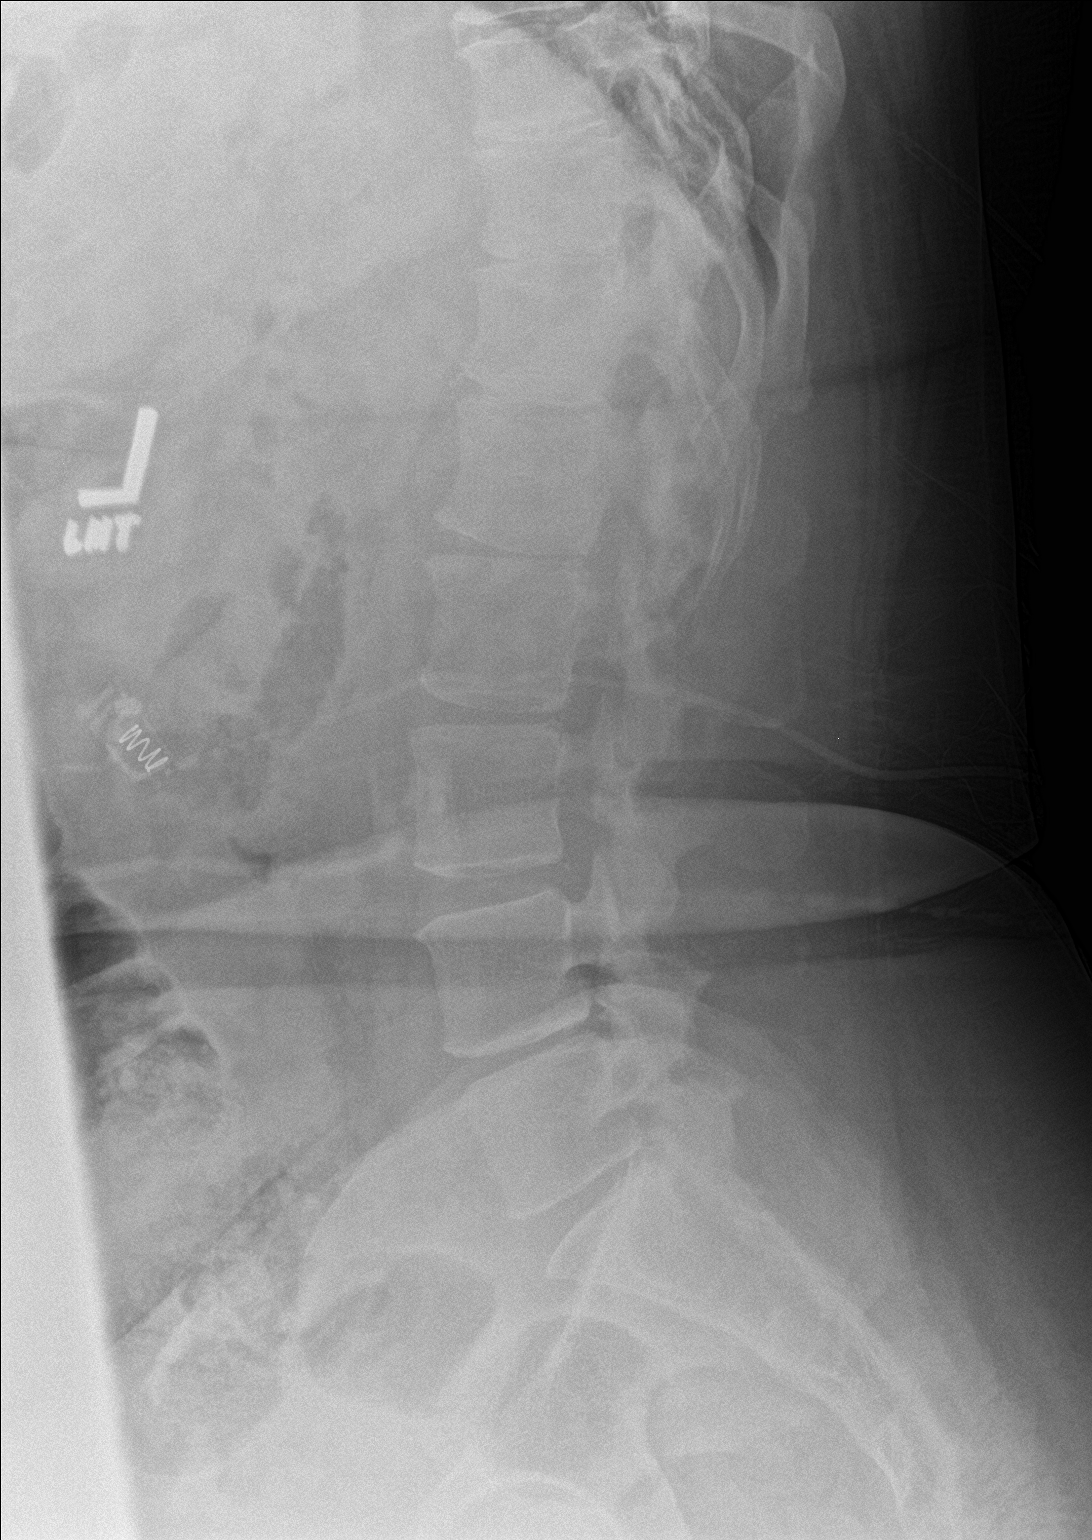

[l-spine spot]
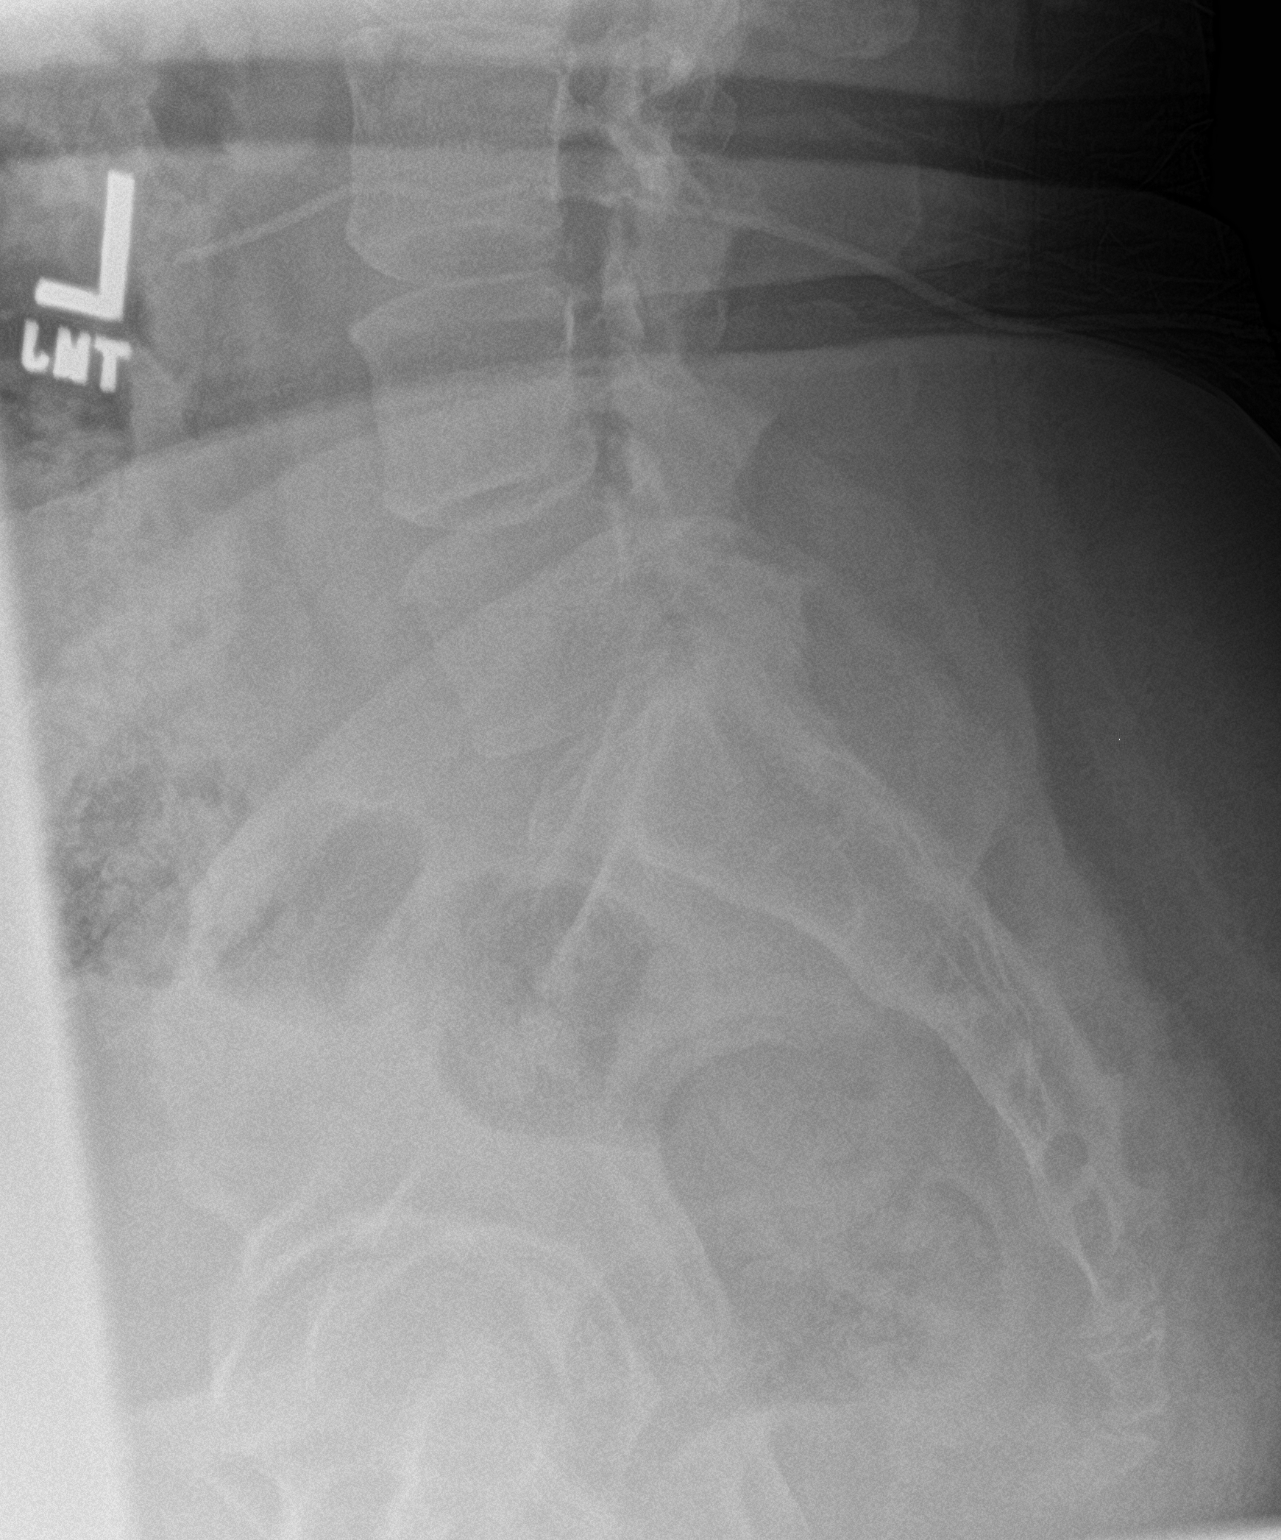

[l-spine ap]
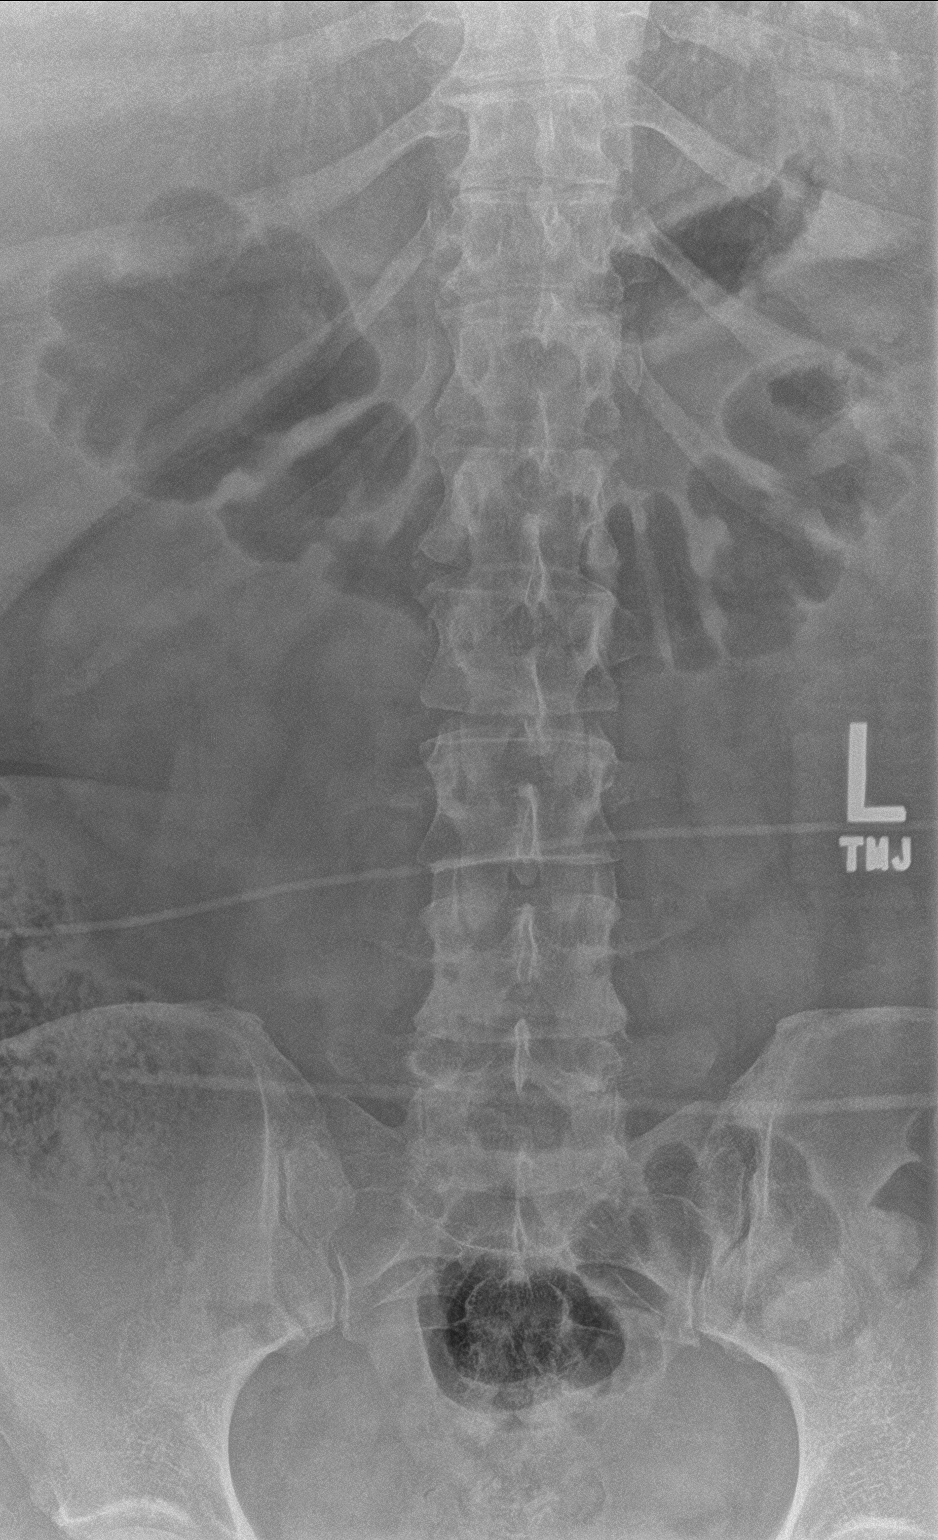

[3 of 3 positions shown; findings below may reference images not displayed]

FINDINGS: Osseous demineralization.

Five non-rib-bearing lumbar vertebra.

Disc space narrowing L4-L5.

Vertebral body and disc space heights otherwise maintained.

No acute fracture, subluxation, or bone destruction.

No gross evidence of spondylolysis.

SI joints preserved.
IMPRESSION: Degenerative disc disease changes at L4-L5.

## 2024-04-15 ENCOUNTER — Other Ambulatory Visit (HOSPITAL_COMMUNITY): Payer: Self-pay
